# Patient Record
Sex: Male | Born: 1940 | Race: Black or African American | Hispanic: No | Marital: Married | Smoking: Former smoker
Health system: Southern US, Community
[De-identification: ages and names within clinical notes are randomized; demographics above are authoritative.]

## PROBLEM LIST (undated history)

## (undated) DIAGNOSIS — E785 Hyperlipidemia, unspecified: Secondary | ICD-10-CM

## (undated) HISTORY — DX: Hyperlipidemia, unspecified: E78.5

---

## 1999-01-04 ENCOUNTER — Ambulatory Visit (HOSPITAL_COMMUNITY): Admission: RE | Admit: 1999-01-04 | Discharge: 1999-01-04 | Payer: Self-pay | Admitting: *Deleted

## 2000-10-16 ENCOUNTER — Encounter: Payer: Self-pay | Admitting: Surgery

## 2000-10-20 ENCOUNTER — Ambulatory Visit (HOSPITAL_COMMUNITY): Admission: RE | Admit: 2000-10-20 | Discharge: 2000-10-20 | Payer: Self-pay | Admitting: Surgery

## 2003-09-24 HISTORY — PX: INGUINAL HERNIA REPAIR: SHX194

## 2003-12-30 ENCOUNTER — Ambulatory Visit (HOSPITAL_COMMUNITY): Admission: RE | Admit: 2003-12-30 | Discharge: 2003-12-30 | Payer: Self-pay | Admitting: Surgery

## 2004-01-03 ENCOUNTER — Emergency Department (HOSPITAL_COMMUNITY): Admission: EM | Admit: 2004-01-03 | Discharge: 2004-01-03 | Payer: Self-pay | Admitting: Emergency Medicine

## 2004-06-18 ENCOUNTER — Ambulatory Visit (HOSPITAL_COMMUNITY): Admission: RE | Admit: 2004-06-18 | Discharge: 2004-06-18 | Payer: Self-pay | Admitting: *Deleted

## 2004-09-11 ENCOUNTER — Encounter: Admission: RE | Admit: 2004-09-11 | Discharge: 2004-09-11 | Payer: Self-pay | Admitting: Otolaryngology

## 2005-01-03 ENCOUNTER — Encounter: Admission: RE | Admit: 2005-01-03 | Discharge: 2005-01-03 | Payer: Self-pay | Admitting: Internal Medicine

## 2005-07-01 ENCOUNTER — Ambulatory Visit (HOSPITAL_COMMUNITY): Admission: RE | Admit: 2005-07-01 | Discharge: 2005-07-02 | Payer: Self-pay | Admitting: Ophthalmology

## 2007-08-04 ENCOUNTER — Encounter: Admission: RE | Admit: 2007-08-04 | Discharge: 2007-08-04 | Payer: Self-pay | Admitting: Interventional Radiology

## 2007-10-07 ENCOUNTER — Encounter: Admission: RE | Admit: 2007-10-07 | Discharge: 2007-10-07 | Payer: Self-pay | Admitting: Interventional Radiology

## 2007-10-15 ENCOUNTER — Encounter: Admission: RE | Admit: 2007-10-15 | Discharge: 2007-10-15 | Payer: Self-pay | Admitting: Interventional Radiology

## 2007-11-12 ENCOUNTER — Encounter: Admission: RE | Admit: 2007-11-12 | Discharge: 2007-11-12 | Payer: Self-pay | Admitting: Interventional Radiology

## 2008-02-17 ENCOUNTER — Encounter: Admission: RE | Admit: 2008-02-17 | Discharge: 2008-02-17 | Payer: Self-pay | Admitting: Orthopedic Surgery

## 2008-04-13 ENCOUNTER — Encounter: Admission: RE | Admit: 2008-04-13 | Discharge: 2008-04-13 | Payer: Self-pay | Admitting: Interventional Radiology

## 2008-12-31 ENCOUNTER — Emergency Department (HOSPITAL_COMMUNITY): Admission: EM | Admit: 2008-12-31 | Discharge: 2008-12-31 | Payer: Self-pay | Admitting: Emergency Medicine

## 2011-02-08 NOTE — Op Note (Signed)
NAMEKIMBALL, Joseph [name]              ACCOUNT NO.:  [id number]   MEDICAL RECORD NO.:  [id number]          PATIENT TYPE:  AMB   LOCATION:  SDS                          FACILITY:  [facility]   PHYSICIAN:  Lanna Poche, M.D. DATE OF BIRTH:  Jan 21, 1941   DATE OF PROCEDURE:  07/01/2005  DATE OF DISCHARGE:                                 OPERATIVE REPORT   PREOPERATIVE DIAGNOSIS:  Complex retinal detachment with vitreous  constrictions, severe myopia and small cornea, left eye.   POSTOPERATIVE DIAGNOSIS:   OPERATION PERFORMED:  Pars plana vitrectomy, scleral buckle, laser for  retinal break, gas-fluid exchange 60% C3F8, left eye.   SURGEON:  Lanna Poche, M.D.   ASSISTANT:  Ulysees Barns.   ANESTHESIA:  General endotracheal.   ESTIMATED BLOOD LOSS:  Less than 1 mL.   COMPLICATIONS:  None.   DESCRIPTION OF PROCEDURE:  The patient was taken to the operating room where  after induction of general anesthesia, the left eye was prepped and draped  in usual fashion.  The lid speculum was introduced and the cornea diameter  was measured and found to be pathologically small at just under 10 mm.  The  conjunctiva was opened 360 degrees.  Relaxing incision nasally and  temporally.  Hemostasis was obtained with eraser cautery. Sclerotomies were  fashioned 3.75 mm posterior to the limbus at 1:30, 10:30 and 4:30.  Superior  sclerotomies were plugged and a 4 mm infusion cannula was secured to the  inferotemporal sclerotomy with a temporary suture of 7-0 Vicryl.  The tip  was visually inspected and found to be in good position.  A Landers ring was  secured to the globe with 7-0 Vicryl sutures at 3 and 9 o'clock.  Using a 25  gauge vitrector and endo illuminator, a central core followed by peripheral  vitrectomy was performed.  There was a detachment involving approximately  two thirds of the retina with the superonasal quadrant still attached.  There was considerable central vitreous  organization with anterior and  central traction of the retina.  After completion of the central core  vitrectomy and a partial peripheral vitrectomy, perfluoron was used to  stabilize the posterior pole and flatten the retina somewhat.  Scleral  depression was then used to meticulously trim the vitreous base.  The  vitreous base was found to be pathologically long at its insertion from the  ora serrata almost back to the equator.  At the equatorial retina, there  were several tears seen in the temporal retina.  No additional breaks or  tears were seen.  After completion of the peripheral vitrectomy, the  instruments were removed from the eye and the holes were plugged.  Scleral  depression with the indirect ophthalmoscope showed there to be no additional  breaks or tears. Perfluoron was instilled in the eye for the full volume,  flattening the retina.  Indirect laser photocoagulation was then applied 360  degrees for several rows posterior to the ora with a good treatment all way  back to the equator and around the large retinal and jagged breaks at the  equatorial  retina temporally.  After completion of the peripheral laser, a  gas-fluid exchange was then performed with the indirect followed by the  biconcave lens.  Inspection of the posterior pole showed there to be good  laser around the large breaks with three small linear slits in the retina  seen posterior to the equatorial breaks.  The indirect laser was used to  photocoagulate around these breaks as well with inspection afterwards  internally showed there to be good photocoagulation around all three retinal  breaks.  Macula was flat and attached.  The superior sclerotomy was then  temporarily closed with 7-0 Vicryl.  The lid speculum was then removed and  the lids placed on traction sutures of 4-0 silk.  The four rectus muscles  were isolated and slung on sutures of 2-0 silk.  The four quadrants were  inspected and found not to be  thin.  The temporal limbus to muscle insertion  ws measured and found to be almost at 9 to 10 mm in length.  A 277 buckle  with a 240 band was then passed 360 degrees under the rectus muscles and  joined in the superonasal quadrant with a 270 sleeve.  Mattress sutures of 4-  0 Mersilene were passed two to each quadrant in a horizontal mattress  fashion.  The ends were rotated posteriorly.  After completion of the  buckle, the internal inspection showed there to be good buckle at 360  degrees with the posterior breaks on the downslope of the buckle and the  large ____was fairly well supported by the buckle.  The traction sutures  were then removed and the lid speculum was introduced.  The superotemporal  sclerotomy was closed with 7-0 Vicryl permanently.  A mixture of 16% C3F8  was drawn up in the usual sterile fashion and 20 mL infused in the eye with  egress through the superonasal sclerotomy.  The superonasal sclerotomy was  then closed.  The pressure was adjusted to 21 mmHg with Barraquer tonometer.  Infusion cannula was removed and preplaced sutures secured.  The conjunctiva  was then drawn up and reapproximated with interrupted and running sutures of  6-0 plain gut.  Subconjunctival space was irrigated with 0.75% Marcaine as  well as mixed antibiotic solution followed by subconjunctival injection of  100 mg of ceftazidime and 10 mg of Decadron.  The lid speculum was then  removed and mixed antibiotic ointment was applied to the surface of the eye.  An eye patch and shield were then placed on the patient's eye.  Upon waking  from anesthesia, the patient left the operating room in stable condition.      Lanna Poche, M.D.     JTH/MEDQ  D:  07/01/2005  T:  07/02/2005  Job:  [id number]

## 2011-02-08 NOTE — Op Note (Signed)
NAME:  Joseph [name], Joseph [name]                        ACCOUNT NO.:  [id number]   MEDICAL RECORD NO.:  [id number]                   PATIENT TYPE:  AMB   LOCATION:  DAY                                  [facility]ACILITY:  [facility]   PHYSICIAN:  Abigail Miyamoto, M.D.              DATE OF BIRTH:  August 27, 1941   DATE OF PROCEDURE:  12/30/2003  DATE OF DISCHARGE:                                 OPERATIVE REPORT   PREOPERATIVE DIAGNOSES:  Recurrent right inguinal hernia.   POSTOPERATIVE DIAGNOSES:  Recurrent right inguinal hernia.   PROCEDURE:  Right inguinal hernia repair with mesh.   SURGEON:  Abigail Miyamoto, M.D.   ANESTHESIA:  General endotracheal anesthesia and 0.5% Marcaine.   ESTIMATED BLOOD LOSS:  Minimal.   FINDINGS:  The patient was found to have a large hernia coming through the  internal ring that was separate from the cord structures. It appeared that  the laparoscopic mesh had failed laterally.   DESCRIPTION OF PROCEDURE:  The patient was brought to the operating room,  identified as Joseph [name].  He was placed supine on the operating room  table and general anesthesia was induced.  His right groin was then prepped  and draped in the usual sterile fashion.  An ilioinguinal nerve block was  then performed with 0.5% Marcaine, the skin was likewise anesthetized with  0.5% Marcaine. A longitudinal incision was then made in the right groin with  the #15 blade. The incision was carried down through Scarpa's fascia with  electrocautery. The external oblique fascia was then identified and opened  with electrocautery and further toward the external ring with the Metzenbaum  scissors.  A large hernia sac was then easily identified and contained  omentum. It appeared separate from the testicular cord and its structures.  The testicular cord and its strictures were controlled with a Penrose drain.  The hernia sac itself appeared to be coming out of the internal ring.  The  contents were reduced  back into the internal ring and the preperitoneal  space examined with a finger. The mesh was felt to be intact but may have  pulled away from the area of the internal ring.  At this point, a piece of  Prolene mesh from the [employer] hernia Prolene system was brought onto the  field. An extended piece of mesh was used.  The preperitoneal portion was  then placed down through the internal ring and opened up in the  preperitoneal space. The overlay portion was then opened up in the inguinal  floor. A slit was cut in it to incorporate around the cord structures. The  mesh was then sewn in place with interrupted 2-0 Vicryl sutures setting it  first to the pubic tubercle and then medially transversalis fascia and then  laterally along the shelving edge of the inguinal ligament. It was then sewn  around the cord structures.  Good coverage of the inguinal floor appeared  to  be achieved.  The external oblique fascia was then closed over this with a  running 2-0 Vicryl suture. Scarpa's fascia was closed with interrupted 3-0  Vicryl sutures, skin  was closed running 4-0 Monocryl. Steri-Strips, gauze and tape were then  applied. The patient tolerated the procedure well. All counts were correct  at the end of the procedure. The patient was then extubated in the operating  room and taken in stable condition to the recovery room.                                               Abigail Miyamoto, M.D.    DB/MEDQ  D:  12/30/2003  T:  12/30/2003  Job:  [id number]

## 2011-02-08 NOTE — Op Note (Signed)
[name], Joseph              ACCOUNT NO.:  [id number]   MEDICAL RECORD NO.:  [id number]          PATIENT TYPE:  AMB   LOCATION:  ENDO                         FACILITY:  [facility]   PHYSICIAN:  Georgiana Spinner, M.D.    DATE OF BIRTH:  08-Apr-1941   DATE OF PROCEDURE:  DATE OF DISCHARGE:                                 OPERATIVE REPORT   PROCEDURE:  Upper endoscopy.   INDICATIONS:  Hemoccult positivity.   ANESTHESIA:  Demerol 50, Versed 4 mg.   PROCEDURE:  With the patient mildly sedated in the left lateral decubitus  position, the Olympus videoscopic endoscope was inserted into the mouth,  passed under direct vision through the esophagus, which appeared normal,  into the stomach.  The fundus, body, antrum, duodenal bulb, and second  portion of the duodenum were visualized.  All appeared normal.  From this  point, the endoscope was slowly withdrawn, taking circumferential views of  the duodenal mucosa until the endoscope had pulled back into the stomach.  Placed in retroflexion, viewed the stomach from below.  The endoscope was  straightened and withdrawn, taking circumferential views of the remaining  gastric and esophageal mucosa.  Patient's vital signs and pulse oximeter  remained stable.  The patient tolerated the procedure well without apparent  complications.   FINDINGS:  Unremarkable examination.   PLAN:  Proceed to colonoscopy.      GMO/MEDQ  D:  06/18/2004  T:  06/18/2004  Job:  [id number]

## 2011-02-08 NOTE — Op Note (Signed)
[facility]  Patient:    ANMOL, FLECK                     MRN: [id number] Proc. Date: 10/20/00 Adm. Date:  [id number] Attending:  Abigail Miyamoto A                           Operative Report  PREOPERATIVE DIAGNOSIS:  Bilateral inguinal hernias and an umbilical hernia.  POSTOPERATIVE DIAGNOSIS:  Bilateral inguinal hernias and an umbilical hernia.  PROCEDURE:  Laparoscopic bilateral inguinal hernia repair with mesh. Umbilical hernia repair.  SURGEON:  Abigail Miyamoto, M.D.  ANESTHESIA:  General endotracheal and 0.25% Marcaine plain.  ESTIMATED BLOOD LOSS:  Minimal.  DESCRIPTION OF PROCEDURE:  The patient was brought to the operating room and identified as Tawny Hopping.  He was placed supine on the operating table, and general anesthesia was induced.  His abdomen and groin were then prepped and draped in the usual sterile fashion.  Using the #15 blade, a small, transverse incision was made at the umbilicus.  Incision was carried down to the fascia which was opened with the scalpel.  The rectus muscle was then identified, and the dissecting balloon was passed underneath the rectus sheath and down toward the pubis.  The dissecting balloon was then insufflated, dissecting out the preperitoneal space.  Once this was done, the balloon was removed, and a separate, small 10 mm port was placed at the umbilicus, and insufflation of the preperitoneal space was begun.  Next, a 5 mm port was placed in the patients midline under direct vision.  The balloon dissector had caused the rupture of a small bleeding vessel just below the pubis.  This was controlled with surgical clips.  A separate 5 mm port was also placed in the midline under direct vision.  Next, dissection of the inguinal area was carried out.  The testicular cord and its structure along with the hernia sac was identified on the right side.  The hernia sac was easily reduced. Coopers ligament  was also identified.  Dissection was then done on the left as well as the hernia sac with the testicular cord structures also identified and reduced.  Both right and left inguinal areas were dissected out well. Next, two separate 6 x 6 pieces of Prolene mesh were brought onto the field and fashioned appropriately.  Each mesh was then placed at the umbilical port and then packed in place in an onlay fashion over both the right and left cord structures, tacking into Coopers ligament and them medial up the abdominal wall and out laterally.  Excellent coverage of both hernia defects appeared to be achieved.  Hemostasis also appeared to be achieved.  At this point, both 5 mm ports were removed, and the preperitoneal space was deflated.  The mesh appeared to lay appropriately.  Next, all ports were removed.  The small defect in the umbilicus was then identified and closed with a figure-of-eight 0 Vicryl suture.  The facial defect from the port placement was then closed with a figure-of-eight 0 Vicryl suture as well.  The skin incisions were all anesthetized with 0.25% Marcaine.  The umbilical incision was then closed with a running 4-0 Monocryl.  The two separate 5 mm ports were closed with Dermabond.  The patient tolerated the procedure well.  All sponge, needle and instrument counts were correct at the end of the procedure.  The patient was then extubated  in the operating room and taken in stable condition to the recovery room. DD:  10/20/00 TD:  10/20/00 Job: [id number]

## 2011-02-08 NOTE — Op Note (Signed)
NAMESUNNY, GAINS              ACCOUNT NO.:  [id number]   MEDICAL RECORD NO.:  [id number]          PATIENT TYPE:  AMB   LOCATION:  ENDO                         FACILITY:  [facility]   PHYSICIAN:  Georgiana Spinner, M.D.    DATE OF BIRTH:  [birth year]   DATE OF PROCEDURE:  DATE OF DISCHARGE:                                 OPERATIVE REPORT   PROCEDURE:  Colonoscopy.   INDICATIONS:  Hemoccult positivity.   ANESTHESIA:  Demerol 10, Versed 3 mg additionally.   PROCEDURE:  With the patient mildly sedated in the left lateral decubitus  position, the Olympus videoscopic colonoscope was inserted into the rectum  and passed under direct vision to the cecum, identified by the ileocecal  valve and the appendiceal orifice, both of which were photographed.  From  this point, the colonoscope was slowly withdrawn, taking circumferential  views of the colonic mucosa, stopping in the rectum, which appeared normal  on direct and showed hemorrhoids on retroflexed view.  The endoscope was  straightened and withdrawn.  Patient's vital signs and pulse oximetry  remained stable.  Patient tolerated the procedure well without apparent  complications.   FINDINGS:  Unremarkable colonoscopic examination, other than hemorrhoids.   PLAN:  Have the patient follow up with me as needed.      GMO/MEDQ  D:  06/18/2004  T:  06/18/2004  Job:  [id number]

## 2011-09-30 DIAGNOSIS — H2510 Age-related nuclear cataract, unspecified eye: Secondary | ICD-10-CM | POA: Diagnosis not present

## 2011-09-30 DIAGNOSIS — H4011X0 Primary open-angle glaucoma, stage unspecified: Secondary | ICD-10-CM | POA: Diagnosis not present

## 2011-10-14 DIAGNOSIS — E7800 Pure hypercholesterolemia, unspecified: Secondary | ICD-10-CM | POA: Diagnosis not present

## 2011-10-14 DIAGNOSIS — D709 Neutropenia, unspecified: Secondary | ICD-10-CM | POA: Diagnosis not present

## 2011-10-14 DIAGNOSIS — E119 Type 2 diabetes mellitus without complications: Secondary | ICD-10-CM | POA: Diagnosis not present

## 2011-10-28 DIAGNOSIS — E119 Type 2 diabetes mellitus without complications: Secondary | ICD-10-CM | POA: Diagnosis not present

## 2011-10-28 DIAGNOSIS — I10 Essential (primary) hypertension: Secondary | ICD-10-CM | POA: Diagnosis not present

## 2011-10-28 DIAGNOSIS — E7800 Pure hypercholesterolemia, unspecified: Secondary | ICD-10-CM | POA: Diagnosis not present

## 2011-10-29 DIAGNOSIS — H2510 Age-related nuclear cataract, unspecified eye: Secondary | ICD-10-CM | POA: Diagnosis not present

## 2011-11-13 DIAGNOSIS — H2510 Age-related nuclear cataract, unspecified eye: Secondary | ICD-10-CM | POA: Diagnosis not present

## 2011-11-13 DIAGNOSIS — IMO0002 Reserved for concepts with insufficient information to code with codable children: Secondary | ICD-10-CM | POA: Diagnosis not present

## 2011-12-11 DIAGNOSIS — Z79899 Other long term (current) drug therapy: Secondary | ICD-10-CM | POA: Diagnosis not present

## 2011-12-11 DIAGNOSIS — E7800 Pure hypercholesterolemia, unspecified: Secondary | ICD-10-CM | POA: Diagnosis not present

## 2011-12-11 DIAGNOSIS — Z125 Encounter for screening for malignant neoplasm of prostate: Secondary | ICD-10-CM | POA: Diagnosis not present

## 2011-12-11 DIAGNOSIS — Z7902 Long term (current) use of antithrombotics/antiplatelets: Secondary | ICD-10-CM | POA: Diagnosis not present

## 2011-12-24 DIAGNOSIS — H409 Unspecified glaucoma: Secondary | ICD-10-CM | POA: Diagnosis not present

## 2011-12-24 DIAGNOSIS — H4011X0 Primary open-angle glaucoma, stage unspecified: Secondary | ICD-10-CM | POA: Diagnosis not present

## 2011-12-24 DIAGNOSIS — H26499 Other secondary cataract, unspecified eye: Secondary | ICD-10-CM | POA: Diagnosis not present

## 2011-12-31 DIAGNOSIS — Z87898 Personal history of other specified conditions: Secondary | ICD-10-CM | POA: Diagnosis not present

## 2011-12-31 DIAGNOSIS — I8390 Asymptomatic varicose veins of unspecified lower extremity: Secondary | ICD-10-CM | POA: Diagnosis not present

## 2011-12-31 DIAGNOSIS — Z0000 Encounter for general adult medical examination without abnormal findings: Secondary | ICD-10-CM | POA: Diagnosis not present

## 2011-12-31 DIAGNOSIS — D709 Neutropenia, unspecified: Secondary | ICD-10-CM | POA: Diagnosis not present

## 2011-12-31 DIAGNOSIS — E119 Type 2 diabetes mellitus without complications: Secondary | ICD-10-CM | POA: Diagnosis not present

## 2012-01-07 DIAGNOSIS — Z0000 Encounter for general adult medical examination without abnormal findings: Secondary | ICD-10-CM | POA: Diagnosis not present

## 2012-01-23 DIAGNOSIS — E119 Type 2 diabetes mellitus without complications: Secondary | ICD-10-CM | POA: Diagnosis not present

## 2012-01-23 DIAGNOSIS — E7800 Pure hypercholesterolemia, unspecified: Secondary | ICD-10-CM | POA: Diagnosis not present

## 2012-01-27 DIAGNOSIS — E7800 Pure hypercholesterolemia, unspecified: Secondary | ICD-10-CM | POA: Diagnosis not present

## 2012-01-27 DIAGNOSIS — I10 Essential (primary) hypertension: Secondary | ICD-10-CM | POA: Diagnosis not present

## 2012-01-27 DIAGNOSIS — E119 Type 2 diabetes mellitus without complications: Secondary | ICD-10-CM | POA: Diagnosis not present

## 2012-02-06 DIAGNOSIS — H4011X0 Primary open-angle glaucoma, stage unspecified: Secondary | ICD-10-CM | POA: Diagnosis not present

## 2012-03-19 DIAGNOSIS — E119 Type 2 diabetes mellitus without complications: Secondary | ICD-10-CM | POA: Diagnosis not present

## 2012-04-28 DIAGNOSIS — E119 Type 2 diabetes mellitus without complications: Secondary | ICD-10-CM | POA: Diagnosis not present

## 2012-04-28 DIAGNOSIS — E7800 Pure hypercholesterolemia, unspecified: Secondary | ICD-10-CM | POA: Diagnosis not present

## 2012-05-14 DIAGNOSIS — E7800 Pure hypercholesterolemia, unspecified: Secondary | ICD-10-CM | POA: Diagnosis not present

## 2012-05-14 DIAGNOSIS — I10 Essential (primary) hypertension: Secondary | ICD-10-CM | POA: Diagnosis not present

## 2012-05-14 DIAGNOSIS — E119 Type 2 diabetes mellitus without complications: Secondary | ICD-10-CM | POA: Diagnosis not present

## 2012-06-08 DIAGNOSIS — Z23 Encounter for immunization: Secondary | ICD-10-CM | POA: Diagnosis not present

## 2012-07-08 DIAGNOSIS — H4011X0 Primary open-angle glaucoma, stage unspecified: Secondary | ICD-10-CM | POA: Diagnosis not present

## 2012-07-08 DIAGNOSIS — H31019 Macula scars of posterior pole (postinflammatory) (post-traumatic), unspecified eye: Secondary | ICD-10-CM | POA: Diagnosis not present

## 2012-07-08 DIAGNOSIS — H2700 Aphakia, unspecified eye: Secondary | ICD-10-CM | POA: Diagnosis not present

## 2012-11-10 DIAGNOSIS — E7800 Pure hypercholesterolemia, unspecified: Secondary | ICD-10-CM | POA: Diagnosis not present

## 2012-11-10 DIAGNOSIS — E119 Type 2 diabetes mellitus without complications: Secondary | ICD-10-CM | POA: Diagnosis not present

## 2012-11-12 DIAGNOSIS — I10 Essential (primary) hypertension: Secondary | ICD-10-CM | POA: Diagnosis not present

## 2012-11-12 DIAGNOSIS — E119 Type 2 diabetes mellitus without complications: Secondary | ICD-10-CM | POA: Diagnosis not present

## 2012-11-12 DIAGNOSIS — E7800 Pure hypercholesterolemia, unspecified: Secondary | ICD-10-CM | POA: Diagnosis not present

## 2012-12-07 DIAGNOSIS — H2700 Aphakia, unspecified eye: Secondary | ICD-10-CM | POA: Diagnosis not present

## 2012-12-07 DIAGNOSIS — H31019 Macula scars of posterior pole (postinflammatory) (post-traumatic), unspecified eye: Secondary | ICD-10-CM | POA: Diagnosis not present

## 2012-12-07 DIAGNOSIS — H4011X0 Primary open-angle glaucoma, stage unspecified: Secondary | ICD-10-CM | POA: Diagnosis not present

## 2012-12-30 DIAGNOSIS — Z125 Encounter for screening for malignant neoplasm of prostate: Secondary | ICD-10-CM | POA: Diagnosis not present

## 2012-12-30 DIAGNOSIS — E7800 Pure hypercholesterolemia, unspecified: Secondary | ICD-10-CM | POA: Diagnosis not present

## 2012-12-30 DIAGNOSIS — Z0000 Encounter for general adult medical examination without abnormal findings: Secondary | ICD-10-CM | POA: Diagnosis not present

## 2012-12-30 DIAGNOSIS — Z79899 Other long term (current) drug therapy: Secondary | ICD-10-CM | POA: Diagnosis not present

## 2012-12-30 DIAGNOSIS — I10 Essential (primary) hypertension: Secondary | ICD-10-CM | POA: Diagnosis not present

## 2013-01-06 DIAGNOSIS — I10 Essential (primary) hypertension: Secondary | ICD-10-CM | POA: Diagnosis not present

## 2013-01-06 DIAGNOSIS — E7800 Pure hypercholesterolemia, unspecified: Secondary | ICD-10-CM | POA: Diagnosis not present

## 2013-01-06 DIAGNOSIS — G4700 Insomnia, unspecified: Secondary | ICD-10-CM | POA: Diagnosis not present

## 2013-01-06 DIAGNOSIS — E119 Type 2 diabetes mellitus without complications: Secondary | ICD-10-CM | POA: Diagnosis not present

## 2013-02-05 DIAGNOSIS — E119 Type 2 diabetes mellitus without complications: Secondary | ICD-10-CM | POA: Diagnosis not present

## 2013-02-05 DIAGNOSIS — E7800 Pure hypercholesterolemia, unspecified: Secondary | ICD-10-CM | POA: Diagnosis not present

## 2013-02-09 DIAGNOSIS — E119 Type 2 diabetes mellitus without complications: Secondary | ICD-10-CM | POA: Diagnosis not present

## 2013-02-09 DIAGNOSIS — N400 Enlarged prostate without lower urinary tract symptoms: Secondary | ICD-10-CM | POA: Diagnosis not present

## 2013-02-09 DIAGNOSIS — E7800 Pure hypercholesterolemia, unspecified: Secondary | ICD-10-CM | POA: Diagnosis not present

## 2013-02-09 DIAGNOSIS — I10 Essential (primary) hypertension: Secondary | ICD-10-CM | POA: Diagnosis not present

## 2013-03-18 DIAGNOSIS — H2700 Aphakia, unspecified eye: Secondary | ICD-10-CM | POA: Diagnosis not present

## 2013-03-18 DIAGNOSIS — H47239 Glaucomatous optic atrophy, unspecified eye: Secondary | ICD-10-CM | POA: Diagnosis not present

## 2013-05-14 DIAGNOSIS — E119 Type 2 diabetes mellitus without complications: Secondary | ICD-10-CM | POA: Diagnosis not present

## 2013-05-14 DIAGNOSIS — E7800 Pure hypercholesterolemia, unspecified: Secondary | ICD-10-CM | POA: Diagnosis not present

## 2013-05-20 DIAGNOSIS — E7800 Pure hypercholesterolemia, unspecified: Secondary | ICD-10-CM | POA: Diagnosis not present

## 2013-05-20 DIAGNOSIS — Z006 Encounter for examination for normal comparison and control in clinical research program: Secondary | ICD-10-CM | POA: Diagnosis not present

## 2013-05-20 DIAGNOSIS — I10 Essential (primary) hypertension: Secondary | ICD-10-CM | POA: Diagnosis not present

## 2013-05-20 DIAGNOSIS — E119 Type 2 diabetes mellitus without complications: Secondary | ICD-10-CM | POA: Diagnosis not present

## 2013-07-08 DIAGNOSIS — Z23 Encounter for immunization: Secondary | ICD-10-CM | POA: Diagnosis not present

## 2013-07-19 DIAGNOSIS — H47239 Glaucomatous optic atrophy, unspecified eye: Secondary | ICD-10-CM | POA: Diagnosis not present

## 2013-07-19 DIAGNOSIS — H4011X0 Primary open-angle glaucoma, stage unspecified: Secondary | ICD-10-CM | POA: Diagnosis not present

## 2013-07-19 DIAGNOSIS — H2700 Aphakia, unspecified eye: Secondary | ICD-10-CM | POA: Diagnosis not present

## 2013-09-28 DIAGNOSIS — E7800 Pure hypercholesterolemia, unspecified: Secondary | ICD-10-CM | POA: Diagnosis not present

## 2013-09-28 DIAGNOSIS — E119 Type 2 diabetes mellitus without complications: Secondary | ICD-10-CM | POA: Diagnosis not present

## 2013-09-30 DIAGNOSIS — I10 Essential (primary) hypertension: Secondary | ICD-10-CM | POA: Diagnosis not present

## 2013-09-30 DIAGNOSIS — E119 Type 2 diabetes mellitus without complications: Secondary | ICD-10-CM | POA: Diagnosis not present

## 2013-09-30 DIAGNOSIS — E7800 Pure hypercholesterolemia, unspecified: Secondary | ICD-10-CM | POA: Diagnosis not present

## 2013-10-19 DIAGNOSIS — H47239 Glaucomatous optic atrophy, unspecified eye: Secondary | ICD-10-CM | POA: Diagnosis not present

## 2013-10-19 DIAGNOSIS — H2700 Aphakia, unspecified eye: Secondary | ICD-10-CM | POA: Diagnosis not present

## 2013-10-19 DIAGNOSIS — H4011X0 Primary open-angle glaucoma, stage unspecified: Secondary | ICD-10-CM | POA: Diagnosis not present

## 2013-12-28 DIAGNOSIS — E7800 Pure hypercholesterolemia, unspecified: Secondary | ICD-10-CM | POA: Diagnosis not present

## 2013-12-28 DIAGNOSIS — E119 Type 2 diabetes mellitus without complications: Secondary | ICD-10-CM | POA: Diagnosis not present

## 2013-12-30 DIAGNOSIS — E119 Type 2 diabetes mellitus without complications: Secondary | ICD-10-CM | POA: Diagnosis not present

## 2013-12-30 DIAGNOSIS — I10 Essential (primary) hypertension: Secondary | ICD-10-CM | POA: Diagnosis not present

## 2013-12-30 DIAGNOSIS — E7800 Pure hypercholesterolemia, unspecified: Secondary | ICD-10-CM | POA: Diagnosis not present

## 2014-01-14 DIAGNOSIS — Z125 Encounter for screening for malignant neoplasm of prostate: Secondary | ICD-10-CM | POA: Diagnosis not present

## 2014-01-14 DIAGNOSIS — N400 Enlarged prostate without lower urinary tract symptoms: Secondary | ICD-10-CM | POA: Diagnosis not present

## 2014-01-14 DIAGNOSIS — Z0000 Encounter for general adult medical examination without abnormal findings: Secondary | ICD-10-CM | POA: Diagnosis not present

## 2014-01-14 DIAGNOSIS — Z23 Encounter for immunization: Secondary | ICD-10-CM | POA: Diagnosis not present

## 2014-01-14 DIAGNOSIS — E119 Type 2 diabetes mellitus without complications: Secondary | ICD-10-CM | POA: Diagnosis not present

## 2014-01-14 DIAGNOSIS — D72819 Decreased white blood cell count, unspecified: Secondary | ICD-10-CM | POA: Diagnosis not present

## 2014-01-14 DIAGNOSIS — I10 Essential (primary) hypertension: Secondary | ICD-10-CM | POA: Diagnosis not present

## 2014-01-20 DIAGNOSIS — Z961 Presence of intraocular lens: Secondary | ICD-10-CM | POA: Diagnosis not present

## 2014-02-09 DIAGNOSIS — Z1212 Encounter for screening for malignant neoplasm of rectum: Secondary | ICD-10-CM | POA: Diagnosis not present

## 2014-02-09 DIAGNOSIS — E119 Type 2 diabetes mellitus without complications: Secondary | ICD-10-CM | POA: Diagnosis not present

## 2014-02-09 DIAGNOSIS — H409 Unspecified glaucoma: Secondary | ICD-10-CM | POA: Diagnosis not present

## 2014-02-09 DIAGNOSIS — E7800 Pure hypercholesterolemia, unspecified: Secondary | ICD-10-CM | POA: Diagnosis not present

## 2014-02-09 DIAGNOSIS — I10 Essential (primary) hypertension: Secondary | ICD-10-CM | POA: Diagnosis not present

## 2014-02-16 DIAGNOSIS — H4011X0 Primary open-angle glaucoma, stage unspecified: Secondary | ICD-10-CM | POA: Diagnosis not present

## 2014-02-16 DIAGNOSIS — H26499 Other secondary cataract, unspecified eye: Secondary | ICD-10-CM | POA: Diagnosis not present

## 2014-03-29 DIAGNOSIS — D72819 Decreased white blood cell count, unspecified: Secondary | ICD-10-CM | POA: Diagnosis not present

## 2014-03-29 DIAGNOSIS — E7800 Pure hypercholesterolemia, unspecified: Secondary | ICD-10-CM | POA: Diagnosis not present

## 2014-03-29 DIAGNOSIS — E119 Type 2 diabetes mellitus without complications: Secondary | ICD-10-CM | POA: Diagnosis not present

## 2014-03-29 DIAGNOSIS — I10 Essential (primary) hypertension: Secondary | ICD-10-CM | POA: Diagnosis not present

## 2014-03-31 DIAGNOSIS — E7800 Pure hypercholesterolemia, unspecified: Secondary | ICD-10-CM | POA: Diagnosis not present

## 2014-03-31 DIAGNOSIS — I10 Essential (primary) hypertension: Secondary | ICD-10-CM | POA: Diagnosis not present

## 2014-03-31 DIAGNOSIS — E119 Type 2 diabetes mellitus without complications: Secondary | ICD-10-CM | POA: Diagnosis not present

## 2014-04-04 DIAGNOSIS — H409 Unspecified glaucoma: Secondary | ICD-10-CM | POA: Diagnosis not present

## 2014-04-04 DIAGNOSIS — E11359 Type 2 diabetes mellitus with proliferative diabetic retinopathy without macular edema: Secondary | ICD-10-CM | POA: Diagnosis not present

## 2014-04-04 DIAGNOSIS — E11311 Type 2 diabetes mellitus with unspecified diabetic retinopathy with macular edema: Secondary | ICD-10-CM | POA: Diagnosis not present

## 2014-04-04 DIAGNOSIS — E1139 Type 2 diabetes mellitus with other diabetic ophthalmic complication: Secondary | ICD-10-CM | POA: Diagnosis not present

## 2014-04-04 DIAGNOSIS — H4011X0 Primary open-angle glaucoma, stage unspecified: Secondary | ICD-10-CM | POA: Diagnosis not present

## 2014-04-11 ENCOUNTER — Encounter (INDEPENDENT_AMBULATORY_CARE_PROVIDER_SITE_OTHER): Payer: Medicare Other | Admitting: Ophthalmology

## 2014-04-11 DIAGNOSIS — H43819 Vitreous degeneration, unspecified eye: Secondary | ICD-10-CM

## 2014-04-11 DIAGNOSIS — H35379 Puckering of macula, unspecified eye: Secondary | ICD-10-CM

## 2014-04-11 DIAGNOSIS — H35359 Cystoid macular degeneration, unspecified eye: Secondary | ICD-10-CM | POA: Diagnosis not present

## 2014-04-11 DIAGNOSIS — H33009 Unspecified retinal detachment with retinal break, unspecified eye: Secondary | ICD-10-CM | POA: Diagnosis not present

## 2014-04-11 DIAGNOSIS — H2700 Aphakia, unspecified eye: Secondary | ICD-10-CM

## 2014-05-23 ENCOUNTER — Encounter (INDEPENDENT_AMBULATORY_CARE_PROVIDER_SITE_OTHER): Payer: Medicare Other | Admitting: Ophthalmology

## 2014-05-23 DIAGNOSIS — H4420 Degenerative myopia, unspecified eye: Secondary | ICD-10-CM

## 2014-05-23 DIAGNOSIS — H35359 Cystoid macular degeneration, unspecified eye: Secondary | ICD-10-CM

## 2014-05-23 DIAGNOSIS — H33009 Unspecified retinal detachment with retinal break, unspecified eye: Secondary | ICD-10-CM

## 2014-05-23 DIAGNOSIS — H43819 Vitreous degeneration, unspecified eye: Secondary | ICD-10-CM

## 2014-05-23 DIAGNOSIS — H35379 Puckering of macula, unspecified eye: Secondary | ICD-10-CM

## 2014-05-23 DIAGNOSIS — H2700 Aphakia, unspecified eye: Secondary | ICD-10-CM

## 2014-06-15 ENCOUNTER — Encounter (INDEPENDENT_AMBULATORY_CARE_PROVIDER_SITE_OTHER): Payer: Medicare Other | Admitting: Ophthalmology

## 2014-06-15 DIAGNOSIS — H43819 Vitreous degeneration, unspecified eye: Secondary | ICD-10-CM

## 2014-06-15 DIAGNOSIS — H4420 Degenerative myopia, unspecified eye: Secondary | ICD-10-CM

## 2014-06-15 DIAGNOSIS — H35359 Cystoid macular degeneration, unspecified eye: Secondary | ICD-10-CM | POA: Diagnosis not present

## 2014-06-15 DIAGNOSIS — H35379 Puckering of macula, unspecified eye: Secondary | ICD-10-CM

## 2014-06-15 DIAGNOSIS — H33009 Unspecified retinal detachment with retinal break, unspecified eye: Secondary | ICD-10-CM

## 2014-06-24 DIAGNOSIS — E11341 Type 2 diabetes mellitus with severe nonproliferative diabetic retinopathy with macular edema: Secondary | ICD-10-CM | POA: Diagnosis not present

## 2014-06-24 DIAGNOSIS — H4011X2 Primary open-angle glaucoma, moderate stage: Secondary | ICD-10-CM | POA: Diagnosis not present

## 2014-06-28 DIAGNOSIS — E119 Type 2 diabetes mellitus without complications: Secondary | ICD-10-CM | POA: Diagnosis not present

## 2014-06-28 DIAGNOSIS — E780 Pure hypercholesterolemia: Secondary | ICD-10-CM | POA: Diagnosis not present

## 2014-06-28 DIAGNOSIS — I10 Essential (primary) hypertension: Secondary | ICD-10-CM | POA: Diagnosis not present

## 2014-06-30 DIAGNOSIS — E119 Type 2 diabetes mellitus without complications: Secondary | ICD-10-CM | POA: Diagnosis not present

## 2014-06-30 DIAGNOSIS — I10 Essential (primary) hypertension: Secondary | ICD-10-CM | POA: Diagnosis not present

## 2014-06-30 DIAGNOSIS — D72819 Decreased white blood cell count, unspecified: Secondary | ICD-10-CM | POA: Diagnosis not present

## 2014-06-30 DIAGNOSIS — E780 Pure hypercholesterolemia: Secondary | ICD-10-CM | POA: Diagnosis not present

## 2014-07-25 ENCOUNTER — Encounter (INDEPENDENT_AMBULATORY_CARE_PROVIDER_SITE_OTHER): Payer: Medicare Other | Admitting: Ophthalmology

## 2014-07-27 ENCOUNTER — Encounter (INDEPENDENT_AMBULATORY_CARE_PROVIDER_SITE_OTHER): Payer: Medicare Other | Admitting: Ophthalmology

## 2014-07-27 DIAGNOSIS — H2702 Aphakia, left eye: Secondary | ICD-10-CM

## 2014-07-27 DIAGNOSIS — H59031 Cystoid macular edema following cataract surgery, right eye: Secondary | ICD-10-CM | POA: Diagnosis not present

## 2014-07-27 DIAGNOSIS — H35371 Puckering of macula, right eye: Secondary | ICD-10-CM

## 2014-07-27 DIAGNOSIS — H43813 Vitreous degeneration, bilateral: Secondary | ICD-10-CM

## 2014-07-27 DIAGNOSIS — H4423 Degenerative myopia, bilateral: Secondary | ICD-10-CM | POA: Diagnosis not present

## 2014-07-27 DIAGNOSIS — H35411 Lattice degeneration of retina, right eye: Secondary | ICD-10-CM | POA: Diagnosis not present

## 2014-09-29 DIAGNOSIS — H31019 Macula scars of posterior pole (postinflammatory) (post-traumatic), unspecified eye: Secondary | ICD-10-CM | POA: Diagnosis not present

## 2014-09-29 DIAGNOSIS — E11341 Type 2 diabetes mellitus with severe nonproliferative diabetic retinopathy with macular edema: Secondary | ICD-10-CM | POA: Diagnosis not present

## 2014-09-29 DIAGNOSIS — H4011X2 Primary open-angle glaucoma, moderate stage: Secondary | ICD-10-CM | POA: Diagnosis not present

## 2014-10-25 DIAGNOSIS — D175 Benign lipomatous neoplasm of intra-abdominal organs: Secondary | ICD-10-CM | POA: Diagnosis not present

## 2014-10-25 DIAGNOSIS — Z86010 Personal history of colonic polyps: Secondary | ICD-10-CM | POA: Diagnosis not present

## 2014-10-25 DIAGNOSIS — Z09 Encounter for follow-up examination after completed treatment for conditions other than malignant neoplasm: Secondary | ICD-10-CM | POA: Diagnosis not present

## 2014-10-25 DIAGNOSIS — K5730 Diverticulosis of large intestine without perforation or abscess without bleeding: Secondary | ICD-10-CM | POA: Diagnosis not present

## 2014-11-14 ENCOUNTER — Ambulatory Visit: Payer: Medicare Other

## 2014-11-14 ENCOUNTER — Encounter: Payer: Self-pay | Admitting: Podiatry

## 2014-11-14 ENCOUNTER — Ambulatory Visit (INDEPENDENT_AMBULATORY_CARE_PROVIDER_SITE_OTHER): Payer: 59 | Admitting: Podiatry

## 2014-11-14 VITALS — BP 142/81 | HR 60 | Resp 16

## 2014-11-14 DIAGNOSIS — L600 Ingrowing nail: Secondary | ICD-10-CM

## 2014-11-14 DIAGNOSIS — M79673 Pain in unspecified foot: Secondary | ICD-10-CM

## 2014-11-14 NOTE — Patient Instructions (Signed)

## 2014-11-14 NOTE — Progress Notes (Signed)
   Subjective:    Patient ID: Joseph [name], male    DOB: [birth year], 74 y.o.   MRN: [id number]  HPI Comments: "I have pain on top of the nail"  Patient c/o tenderness 1st toe/nail bilateral, left over right, for few months. He has had the medial corner of nail removed from 1st toenail left. Feels a lot of pressure on top of the nail with shoes.  Toe Pain       Review of Systems  All other systems reviewed and are negative.      Objective:   Physical Exam        Assessment & Plan:

## 2014-11-14 NOTE — Progress Notes (Signed)
Subjective:     Patient ID: Joseph [name], male   DOB: December 08, 1940, 74 y.o.   MRN: [id number]  HPI patient presents stating I have ingrown toenails on both my big toes which bother me and they hurt when she tendon at night or when I wear shoes. Has been going on for at least a few months   Review of Systems  All other systems reviewed and are negative.      Objective:   Physical Exam  Constitutional: He is oriented to person, place, and time.  Cardiovascular: Intact distal pulses.   Musculoskeletal: Normal range of motion.  Neurological: He is oriented to person, place, and time.  Skin: Skin is warm.  Nursing note and vitals reviewed.  neurovascular status intact with muscle strength adequate and range of motion subtalar midtarsal joint within normal limits. Patient's found to have incurvated hallux nail borders medial border bilateral that are painful when pressed and making shoe gear difficult. Patient is well oriented 3 and has good digital perfusion noted     Assessment:     Ingrown toenail deformity with damage to the nailbeds hallux bilateral medial border    Plan:     H&P and condition reviewed discussed with patient. I've recommended removal of the corners and I explained procedure and reviewed risk. Patient wants surgery and today I infiltrated each hallux 60 mg Xylocaine Marcaine mixture under sterile technique remove the medial border exposed matrix and applied phenol 3 applications 30 seconds followed by alcohol lavage and sterile dressing. Gave instructions on soaking and reappoint

## 2014-11-25 ENCOUNTER — Ambulatory Visit (INDEPENDENT_AMBULATORY_CARE_PROVIDER_SITE_OTHER): Payer: 59 | Admitting: Ophthalmology

## 2014-11-25 DIAGNOSIS — H2702 Aphakia, left eye: Secondary | ICD-10-CM

## 2014-11-25 DIAGNOSIS — H43813 Vitreous degeneration, bilateral: Secondary | ICD-10-CM

## 2014-11-25 DIAGNOSIS — H338 Other retinal detachments: Secondary | ICD-10-CM

## 2014-11-25 DIAGNOSIS — H59031 Cystoid macular edema following cataract surgery, right eye: Secondary | ICD-10-CM | POA: Diagnosis not present

## 2014-11-25 DIAGNOSIS — H4423 Degenerative myopia, bilateral: Secondary | ICD-10-CM

## 2015-05-30 ENCOUNTER — Ambulatory Visit (INDEPENDENT_AMBULATORY_CARE_PROVIDER_SITE_OTHER): Payer: 59 | Admitting: Ophthalmology

## 2015-06-26 DIAGNOSIS — Z23 Encounter for immunization: Secondary | ICD-10-CM | POA: Diagnosis not present

## 2015-06-29 DIAGNOSIS — E119 Type 2 diabetes mellitus without complications: Secondary | ICD-10-CM | POA: Diagnosis not present

## 2015-06-29 DIAGNOSIS — E7800 Pure hypercholesterolemia, unspecified: Secondary | ICD-10-CM | POA: Diagnosis not present

## 2015-06-29 DIAGNOSIS — I10 Essential (primary) hypertension: Secondary | ICD-10-CM | POA: Diagnosis not present

## 2015-07-04 ENCOUNTER — Ambulatory Visit (INDEPENDENT_AMBULATORY_CARE_PROVIDER_SITE_OTHER): Payer: 59 | Admitting: Ophthalmology

## 2015-07-04 DIAGNOSIS — H4423 Degenerative myopia, bilateral: Secondary | ICD-10-CM

## 2015-07-04 DIAGNOSIS — H59031 Cystoid macular edema following cataract surgery, right eye: Secondary | ICD-10-CM

## 2015-07-04 DIAGNOSIS — H338 Other retinal detachments: Secondary | ICD-10-CM

## 2015-07-04 DIAGNOSIS — H43813 Vitreous degeneration, bilateral: Secondary | ICD-10-CM

## 2015-07-04 DIAGNOSIS — H35411 Lattice degeneration of retina, right eye: Secondary | ICD-10-CM

## 2015-07-06 DIAGNOSIS — I10 Essential (primary) hypertension: Secondary | ICD-10-CM | POA: Diagnosis not present

## 2015-07-06 DIAGNOSIS — E7800 Pure hypercholesterolemia, unspecified: Secondary | ICD-10-CM | POA: Diagnosis not present

## 2015-07-06 DIAGNOSIS — E119 Type 2 diabetes mellitus without complications: Secondary | ICD-10-CM | POA: Diagnosis not present

## 2015-07-14 DIAGNOSIS — H401132 Primary open-angle glaucoma, bilateral, moderate stage: Secondary | ICD-10-CM | POA: Diagnosis not present

## 2015-07-14 DIAGNOSIS — E113411 Type 2 diabetes mellitus with severe nonproliferative diabetic retinopathy with macular edema, right eye: Secondary | ICD-10-CM | POA: Diagnosis not present

## 2015-07-14 DIAGNOSIS — H31019 Macula scars of posterior pole (postinflammatory) (post-traumatic), unspecified eye: Secondary | ICD-10-CM | POA: Diagnosis not present

## 2015-07-20 DIAGNOSIS — H52221 Regular astigmatism, right eye: Secondary | ICD-10-CM | POA: Diagnosis not present

## 2015-07-20 DIAGNOSIS — E119 Type 2 diabetes mellitus without complications: Secondary | ICD-10-CM | POA: Diagnosis not present

## 2015-07-20 DIAGNOSIS — Z961 Presence of intraocular lens: Secondary | ICD-10-CM | POA: Diagnosis not present

## 2015-07-20 DIAGNOSIS — H401112 Primary open-angle glaucoma, right eye, moderate stage: Secondary | ICD-10-CM | POA: Diagnosis not present

## 2015-11-14 DIAGNOSIS — H401133 Primary open-angle glaucoma, bilateral, severe stage: Secondary | ICD-10-CM | POA: Diagnosis not present

## 2016-01-09 ENCOUNTER — Ambulatory Visit (INDEPENDENT_AMBULATORY_CARE_PROVIDER_SITE_OTHER): Payer: Medicare HMO | Admitting: Ophthalmology

## 2016-01-09 DIAGNOSIS — H338 Other retinal detachments: Secondary | ICD-10-CM

## 2016-01-09 DIAGNOSIS — H35371 Puckering of macula, right eye: Secondary | ICD-10-CM

## 2016-01-09 DIAGNOSIS — H43813 Vitreous degeneration, bilateral: Secondary | ICD-10-CM | POA: Diagnosis not present

## 2016-01-09 DIAGNOSIS — H4423 Degenerative myopia, bilateral: Secondary | ICD-10-CM | POA: Diagnosis not present

## 2016-01-09 DIAGNOSIS — H2702 Aphakia, left eye: Secondary | ICD-10-CM | POA: Diagnosis not present

## 2016-01-09 DIAGNOSIS — H59031 Cystoid macular edema following cataract surgery, right eye: Secondary | ICD-10-CM | POA: Diagnosis not present

## 2016-04-01 DIAGNOSIS — E7800 Pure hypercholesterolemia, unspecified: Secondary | ICD-10-CM | POA: Diagnosis not present

## 2016-04-01 DIAGNOSIS — E119 Type 2 diabetes mellitus without complications: Secondary | ICD-10-CM | POA: Diagnosis not present

## 2016-04-01 DIAGNOSIS — I10 Essential (primary) hypertension: Secondary | ICD-10-CM | POA: Diagnosis not present

## 2016-04-04 DIAGNOSIS — E119 Type 2 diabetes mellitus without complications: Secondary | ICD-10-CM | POA: Diagnosis not present

## 2016-04-04 DIAGNOSIS — I10 Essential (primary) hypertension: Secondary | ICD-10-CM | POA: Diagnosis not present

## 2016-04-04 DIAGNOSIS — E7800 Pure hypercholesterolemia, unspecified: Secondary | ICD-10-CM | POA: Diagnosis not present

## 2016-05-22 DIAGNOSIS — H401132 Primary open-angle glaucoma, bilateral, moderate stage: Secondary | ICD-10-CM | POA: Diagnosis not present

## 2016-05-22 DIAGNOSIS — E113413 Type 2 diabetes mellitus with severe nonproliferative diabetic retinopathy with macular edema, bilateral: Secondary | ICD-10-CM | POA: Diagnosis not present

## 2016-05-22 DIAGNOSIS — H31019 Macula scars of posterior pole (postinflammatory) (post-traumatic), unspecified eye: Secondary | ICD-10-CM | POA: Diagnosis not present

## 2016-07-11 ENCOUNTER — Ambulatory Visit (INDEPENDENT_AMBULATORY_CARE_PROVIDER_SITE_OTHER): Payer: Medicare HMO | Admitting: Ophthalmology

## 2016-07-11 DIAGNOSIS — H338 Other retinal detachments: Secondary | ICD-10-CM

## 2016-07-11 DIAGNOSIS — H4423 Degenerative myopia, bilateral: Secondary | ICD-10-CM

## 2016-07-11 DIAGNOSIS — H59031 Cystoid macular edema following cataract surgery, right eye: Secondary | ICD-10-CM | POA: Diagnosis not present

## 2016-07-11 DIAGNOSIS — H43813 Vitreous degeneration, bilateral: Secondary | ICD-10-CM | POA: Diagnosis not present

## 2016-07-18 DIAGNOSIS — Z23 Encounter for immunization: Secondary | ICD-10-CM | POA: Diagnosis not present

## 2016-09-10 DIAGNOSIS — H401132 Primary open-angle glaucoma, bilateral, moderate stage: Secondary | ICD-10-CM | POA: Diagnosis not present

## 2016-09-10 DIAGNOSIS — H31019 Macula scars of posterior pole (postinflammatory) (post-traumatic), unspecified eye: Secondary | ICD-10-CM | POA: Diagnosis not present

## 2016-09-10 DIAGNOSIS — E113413 Type 2 diabetes mellitus with severe nonproliferative diabetic retinopathy with macular edema, bilateral: Secondary | ICD-10-CM | POA: Diagnosis not present

## 2016-09-12 DIAGNOSIS — E119 Type 2 diabetes mellitus without complications: Secondary | ICD-10-CM | POA: Diagnosis not present

## 2016-09-12 DIAGNOSIS — M79672 Pain in left foot: Secondary | ICD-10-CM | POA: Diagnosis not present

## 2016-09-12 DIAGNOSIS — I10 Essential (primary) hypertension: Secondary | ICD-10-CM | POA: Diagnosis not present

## 2016-09-30 DIAGNOSIS — M109 Gout, unspecified: Secondary | ICD-10-CM | POA: Diagnosis not present

## 2016-09-30 DIAGNOSIS — M79672 Pain in left foot: Secondary | ICD-10-CM | POA: Diagnosis not present

## 2016-09-30 DIAGNOSIS — E790 Hyperuricemia without signs of inflammatory arthritis and tophaceous disease: Secondary | ICD-10-CM | POA: Diagnosis not present

## 2016-10-03 DIAGNOSIS — Z791 Long term (current) use of non-steroidal anti-inflammatories (NSAID): Secondary | ICD-10-CM | POA: Diagnosis not present

## 2016-10-03 DIAGNOSIS — E785 Hyperlipidemia, unspecified: Secondary | ICD-10-CM | POA: Diagnosis not present

## 2016-10-03 DIAGNOSIS — Z7982 Long term (current) use of aspirin: Secondary | ICD-10-CM | POA: Diagnosis not present

## 2016-10-03 DIAGNOSIS — N400 Enlarged prostate without lower urinary tract symptoms: Secondary | ICD-10-CM | POA: Diagnosis not present

## 2016-10-03 DIAGNOSIS — Z809 Family history of malignant neoplasm, unspecified: Secondary | ICD-10-CM | POA: Diagnosis not present

## 2016-10-03 DIAGNOSIS — H409 Unspecified glaucoma: Secondary | ICD-10-CM | POA: Diagnosis not present

## 2016-10-03 DIAGNOSIS — H5462 Unqualified visual loss, left eye, normal vision right eye: Secondary | ICD-10-CM | POA: Diagnosis not present

## 2016-10-03 DIAGNOSIS — N529 Male erectile dysfunction, unspecified: Secondary | ICD-10-CM | POA: Diagnosis not present

## 2016-10-03 DIAGNOSIS — I872 Venous insufficiency (chronic) (peripheral): Secondary | ICD-10-CM | POA: Diagnosis not present

## 2016-10-03 DIAGNOSIS — R030 Elevated blood-pressure reading, without diagnosis of hypertension: Secondary | ICD-10-CM | POA: Diagnosis not present

## 2017-01-06 DIAGNOSIS — E7800 Pure hypercholesterolemia, unspecified: Secondary | ICD-10-CM | POA: Diagnosis not present

## 2017-01-06 DIAGNOSIS — E119 Type 2 diabetes mellitus without complications: Secondary | ICD-10-CM | POA: Diagnosis not present

## 2017-01-06 DIAGNOSIS — Z0000 Encounter for general adult medical examination without abnormal findings: Secondary | ICD-10-CM | POA: Diagnosis not present

## 2017-01-06 DIAGNOSIS — Z125 Encounter for screening for malignant neoplasm of prostate: Secondary | ICD-10-CM | POA: Diagnosis not present

## 2017-01-10 ENCOUNTER — Ambulatory Visit (INDEPENDENT_AMBULATORY_CARE_PROVIDER_SITE_OTHER): Payer: Medicare HMO | Admitting: Ophthalmology

## 2017-01-10 DIAGNOSIS — H338 Other retinal detachments: Secondary | ICD-10-CM | POA: Diagnosis not present

## 2017-01-10 DIAGNOSIS — H43813 Vitreous degeneration, bilateral: Secondary | ICD-10-CM | POA: Diagnosis not present

## 2017-01-10 DIAGNOSIS — H4423 Degenerative myopia, bilateral: Secondary | ICD-10-CM

## 2017-01-10 DIAGNOSIS — H35371 Puckering of macula, right eye: Secondary | ICD-10-CM

## 2017-01-10 DIAGNOSIS — H59031 Cystoid macular edema following cataract surgery, right eye: Secondary | ICD-10-CM

## 2017-01-10 DIAGNOSIS — H2702 Aphakia, left eye: Secondary | ICD-10-CM | POA: Diagnosis not present

## 2017-01-13 DIAGNOSIS — I10 Essential (primary) hypertension: Secondary | ICD-10-CM | POA: Diagnosis not present

## 2017-01-13 DIAGNOSIS — E7800 Pure hypercholesterolemia, unspecified: Secondary | ICD-10-CM | POA: Diagnosis not present

## 2017-01-13 DIAGNOSIS — Z0001 Encounter for general adult medical examination with abnormal findings: Secondary | ICD-10-CM | POA: Diagnosis not present

## 2017-01-13 DIAGNOSIS — E119 Type 2 diabetes mellitus without complications: Secondary | ICD-10-CM | POA: Diagnosis not present

## 2017-02-04 DIAGNOSIS — E113413 Type 2 diabetes mellitus with severe nonproliferative diabetic retinopathy with macular edema, bilateral: Secondary | ICD-10-CM | POA: Diagnosis not present

## 2017-02-04 DIAGNOSIS — H401132 Primary open-angle glaucoma, bilateral, moderate stage: Secondary | ICD-10-CM | POA: Diagnosis not present

## 2017-02-04 DIAGNOSIS — H31013 Macula scars of posterior pole (postinflammatory) (post-traumatic), bilateral: Secondary | ICD-10-CM | POA: Diagnosis not present

## 2017-03-05 DIAGNOSIS — R351 Nocturia: Secondary | ICD-10-CM | POA: Diagnosis not present

## 2017-03-05 DIAGNOSIS — N401 Enlarged prostate with lower urinary tract symptoms: Secondary | ICD-10-CM | POA: Diagnosis not present

## 2017-03-05 DIAGNOSIS — N500 Atrophy of testis: Secondary | ICD-10-CM | POA: Diagnosis not present

## 2017-03-05 DIAGNOSIS — R3121 Asymptomatic microscopic hematuria: Secondary | ICD-10-CM | POA: Diagnosis not present

## 2017-03-12 DIAGNOSIS — R3121 Asymptomatic microscopic hematuria: Secondary | ICD-10-CM | POA: Diagnosis not present

## 2017-03-12 DIAGNOSIS — N401 Enlarged prostate with lower urinary tract symptoms: Secondary | ICD-10-CM | POA: Diagnosis not present

## 2017-03-13 ENCOUNTER — Other Ambulatory Visit: Payer: Self-pay | Admitting: Urology

## 2017-03-13 DIAGNOSIS — K862 Cyst of pancreas: Secondary | ICD-10-CM

## 2017-04-01 ENCOUNTER — Ambulatory Visit
Admission: RE | Admit: 2017-04-01 | Discharge: 2017-04-01 | Disposition: A | Discharge status: Home / Self Care | Payer: Medicare HMO | Source: Ambulatory Visit | Attending: Urology | Admitting: Urology

## 2017-04-01 DIAGNOSIS — K5730 Diverticulosis of large intestine without perforation or abscess without bleeding: Secondary | ICD-10-CM | POA: Diagnosis not present

## 2017-04-01 DIAGNOSIS — K862 Cyst of pancreas: Secondary | ICD-10-CM

## 2017-04-01 MED ORDER — GADOBENATE DIMEGLUMINE 529 MG/ML IV SOLN
20.0000 mL | Freq: Once | INTRAVENOUS | Status: AC | PRN
  Administered 2017-04-01: 20 mL via INTRAVENOUS

## 2017-04-17 DIAGNOSIS — N500 Atrophy of testis: Secondary | ICD-10-CM | POA: Diagnosis not present

## 2017-04-23 DIAGNOSIS — N401 Enlarged prostate with lower urinary tract symptoms: Secondary | ICD-10-CM | POA: Diagnosis not present

## 2017-04-23 DIAGNOSIS — N500 Atrophy of testis: Secondary | ICD-10-CM | POA: Diagnosis not present

## 2017-04-23 DIAGNOSIS — N270 Small kidney, unilateral: Secondary | ICD-10-CM | POA: Diagnosis not present

## 2017-04-23 DIAGNOSIS — R3121 Asymptomatic microscopic hematuria: Secondary | ICD-10-CM | POA: Diagnosis not present

## 2017-04-23 DIAGNOSIS — R351 Nocturia: Secondary | ICD-10-CM | POA: Diagnosis not present

## 2017-05-13 DIAGNOSIS — H31012 Macula scars of posterior pole (postinflammatory) (post-traumatic), left eye: Secondary | ICD-10-CM | POA: Diagnosis not present

## 2017-05-13 DIAGNOSIS — H401132 Primary open-angle glaucoma, bilateral, moderate stage: Secondary | ICD-10-CM | POA: Diagnosis not present

## 2017-05-19 DIAGNOSIS — E119 Type 2 diabetes mellitus without complications: Secondary | ICD-10-CM | POA: Diagnosis not present

## 2017-05-19 DIAGNOSIS — N401 Enlarged prostate with lower urinary tract symptoms: Secondary | ICD-10-CM | POA: Diagnosis not present

## 2017-05-19 DIAGNOSIS — E782 Mixed hyperlipidemia: Secondary | ICD-10-CM | POA: Diagnosis not present

## 2017-05-19 DIAGNOSIS — M7742 Metatarsalgia, left foot: Secondary | ICD-10-CM | POA: Diagnosis not present

## 2017-06-24 DIAGNOSIS — Z23 Encounter for immunization: Secondary | ICD-10-CM | POA: Diagnosis not present

## 2017-07-15 ENCOUNTER — Ambulatory Visit (INDEPENDENT_AMBULATORY_CARE_PROVIDER_SITE_OTHER): Payer: Medicare HMO | Admitting: Ophthalmology

## 2017-07-15 DIAGNOSIS — H338 Other retinal detachments: Secondary | ICD-10-CM | POA: Diagnosis not present

## 2017-07-15 DIAGNOSIS — H43813 Vitreous degeneration, bilateral: Secondary | ICD-10-CM

## 2017-07-15 DIAGNOSIS — H4423 Degenerative myopia, bilateral: Secondary | ICD-10-CM | POA: Diagnosis not present

## 2017-07-15 DIAGNOSIS — H59031 Cystoid macular edema following cataract surgery, right eye: Secondary | ICD-10-CM | POA: Diagnosis not present

## 2017-08-28 DIAGNOSIS — H2702 Aphakia, left eye: Secondary | ICD-10-CM | POA: Diagnosis not present

## 2017-08-28 DIAGNOSIS — H31012 Macula scars of posterior pole (postinflammatory) (post-traumatic), left eye: Secondary | ICD-10-CM | POA: Diagnosis not present

## 2017-08-28 DIAGNOSIS — H401132 Primary open-angle glaucoma, bilateral, moderate stage: Secondary | ICD-10-CM | POA: Diagnosis not present

## 2017-10-02 DIAGNOSIS — E119 Type 2 diabetes mellitus without complications: Secondary | ICD-10-CM | POA: Diagnosis not present

## 2017-10-02 DIAGNOSIS — H409 Unspecified glaucoma: Secondary | ICD-10-CM | POA: Diagnosis not present

## 2017-10-02 DIAGNOSIS — Z833 Family history of diabetes mellitus: Secondary | ICD-10-CM | POA: Diagnosis not present

## 2017-10-02 DIAGNOSIS — R32 Unspecified urinary incontinence: Secondary | ICD-10-CM | POA: Diagnosis not present

## 2017-10-02 DIAGNOSIS — Z87891 Personal history of nicotine dependence: Secondary | ICD-10-CM | POA: Diagnosis not present

## 2017-10-02 DIAGNOSIS — Z8249 Family history of ischemic heart disease and other diseases of the circulatory system: Secondary | ICD-10-CM | POA: Diagnosis not present

## 2017-10-02 DIAGNOSIS — N400 Enlarged prostate without lower urinary tract symptoms: Secondary | ICD-10-CM | POA: Diagnosis not present

## 2017-10-02 DIAGNOSIS — N529 Male erectile dysfunction, unspecified: Secondary | ICD-10-CM | POA: Diagnosis not present

## 2017-10-02 DIAGNOSIS — E7800 Pure hypercholesterolemia, unspecified: Secondary | ICD-10-CM | POA: Diagnosis not present

## 2017-10-02 DIAGNOSIS — H5462 Unqualified visual loss, left eye, normal vision right eye: Secondary | ICD-10-CM | POA: Diagnosis not present

## 2017-11-27 DIAGNOSIS — H31012 Macula scars of posterior pole (postinflammatory) (post-traumatic), left eye: Secondary | ICD-10-CM | POA: Diagnosis not present

## 2017-11-27 DIAGNOSIS — H401132 Primary open-angle glaucoma, bilateral, moderate stage: Secondary | ICD-10-CM | POA: Diagnosis not present

## 2017-11-27 DIAGNOSIS — H2702 Aphakia, left eye: Secondary | ICD-10-CM | POA: Diagnosis not present

## 2018-01-19 ENCOUNTER — Encounter (INDEPENDENT_AMBULATORY_CARE_PROVIDER_SITE_OTHER): Payer: Medicare HMO | Admitting: Ophthalmology

## 2018-01-19 DIAGNOSIS — H59031 Cystoid macular edema following cataract surgery, right eye: Secondary | ICD-10-CM

## 2018-01-19 DIAGNOSIS — H35371 Puckering of macula, right eye: Secondary | ICD-10-CM | POA: Diagnosis not present

## 2018-01-19 DIAGNOSIS — H43813 Vitreous degeneration, bilateral: Secondary | ICD-10-CM | POA: Diagnosis not present

## 2018-01-19 DIAGNOSIS — H4423 Degenerative myopia, bilateral: Secondary | ICD-10-CM | POA: Diagnosis not present

## 2018-02-21 IMAGING — MR MR ABDOMEN WO/W CM
16 of 19 series · 40 of 48 positions shown · IV contrast (20 ml multihance)
Comparison: 03/12/2017 CT abdomen/pelvis.

CLINICAL DATA: Evaluate pancreatic neck cystic lesion incidentally
detected on recent CT abdomen/pelvis study performed for
microhematuria.

EXAM:
MRI ABDOMEN WITHOUT AND WITH CONTRAST
TECHNIQUE: Multiplanar multisequence MR imaging of the abdomen was performed
both before and after the administration of intravenous contrast.
CONTRAST:  20mL MULTIHANCE GADOBENATE DIMEGLUMINE 529 MG/ML IV SOLN

[Series 3: T2 · coronal · 5.0mm · 1.56mm/px · 1 of 36 slices shown (1 of 3)]
[im 1/36]
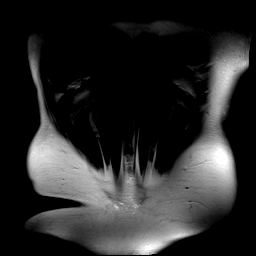

[Series 4: T1 · axial · 3.0mm · 1.19mm/px · z∈[-63,+150]mm · 5 of 144 slices shown]
[im 1/144]
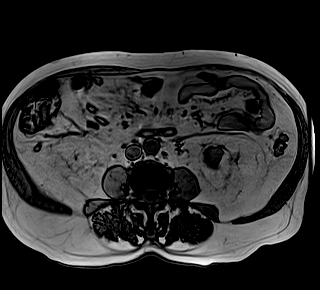
[im 36/144]
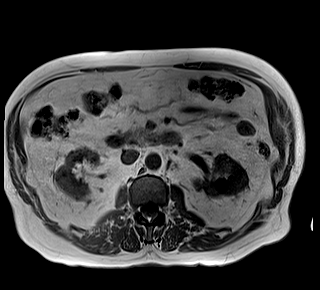
[im 72/144]
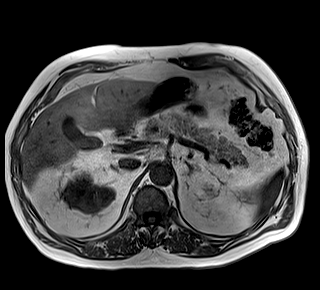
[im 108/144]
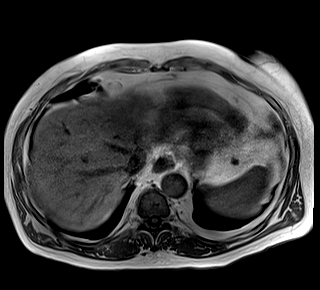
[im 144/144]
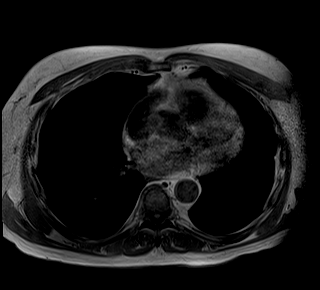

[Series 8: T2 · coronal · 3.0mm · 1.19mm/px · 1 of 19 slices shown (2 of 3)]
[im 1/19]
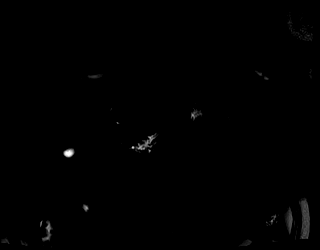

[Series 10: MRCP · coronal · 1.0mm · 0.49mm/px · 2 of 64 slices shown]
[im 1/64]
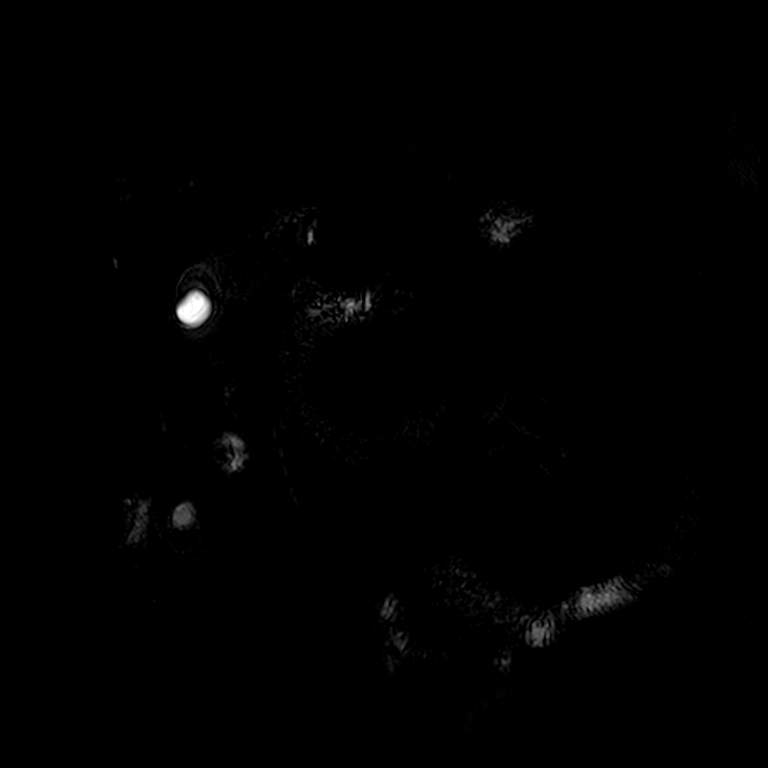
[im 64/64]
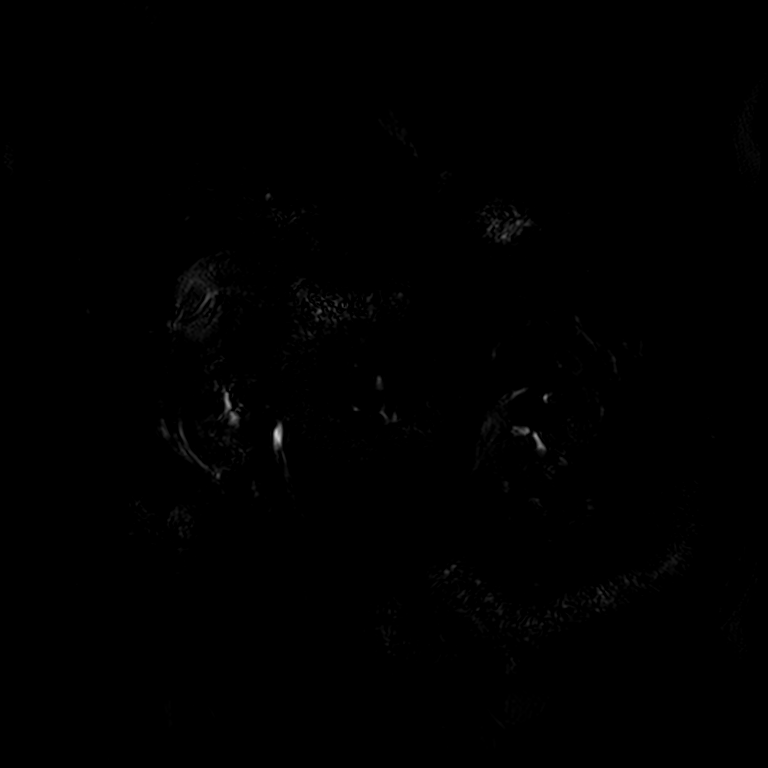

[Series 13: DWI · axial · 5.0mm · 1.42mm/px · z∈[-45,+165]mm · 4 of 108 slices shown (1 of 2)]
[im 1/108]
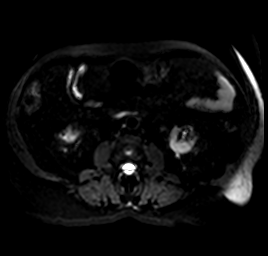
[im 36/108]
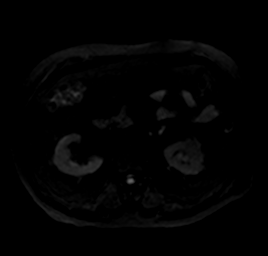
[im 72/108]
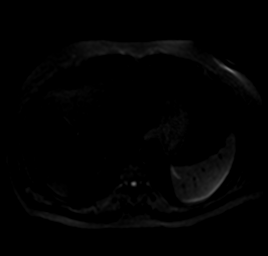
[im 108/108]
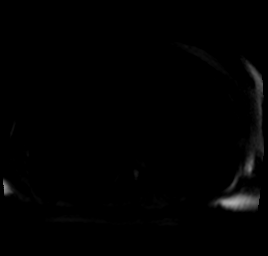

[Series 14: DWI · axial · 5.0mm · 1.42mm/px · 1 of 36 slices shown (2 of 2)]
[im 1/36]
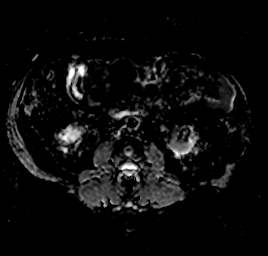

[Series 15: T2 · axial · 6.0mm · 1.22mm/px · 1 of 30 slices shown (3 of 3)]
[im 1/30]
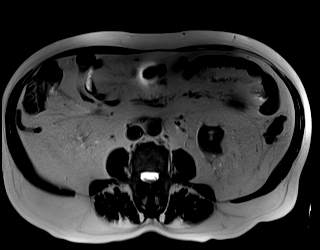

[Series 16: bSSFP · axial · 5.0mm · 1.25mm/px · z∈[-68,+154]mm · 2 of 38 slices shown]
[im 1/38]
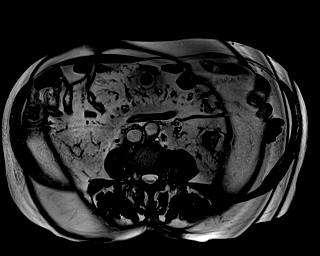
[im 38/38]
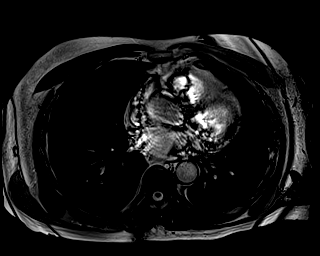

[Series 17: T1 dynamic · axial · non-contrast · 3.0mm · 1.25mm/px · z∈[-63,+150]mm · 3 of 72 slices shown]
[im 1/72]
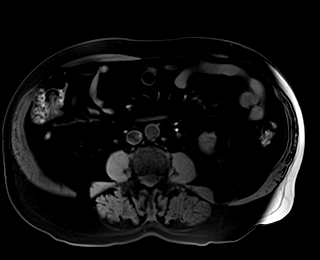
[im 36/72]
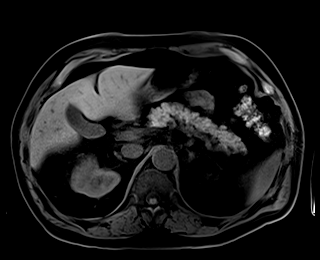
[im 72/72]
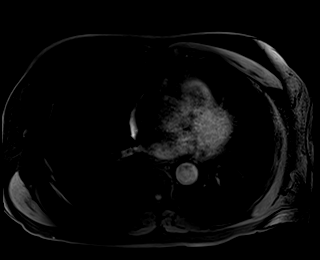

[Series 18: T1 dynamic post-contrast · axial · 3.0mm · 1.25mm/px · z∈[-63,+150]mm · 3 of 72 slices shown (1 of 7)]
[im 1/72]
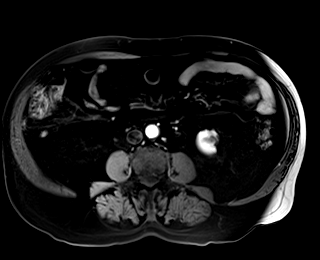
[im 36/72]
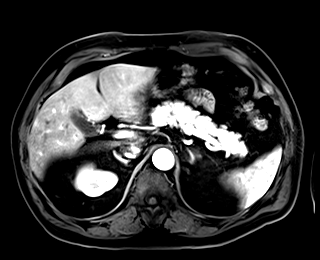
[im 72/72]
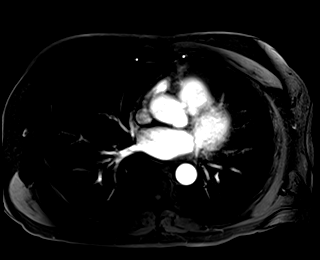

[Series 19: T1 dynamic post-contrast · axial · 3.0mm · 1.25mm/px · z∈[-63,+150]mm · 3 of 72 slices shown (2 of 7)]
[im 1/72]
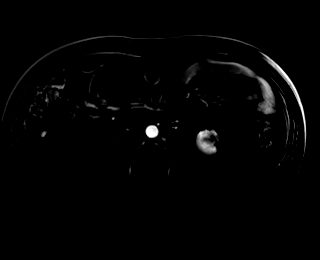
[im 36/72]
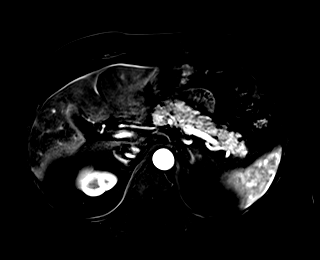
[im 72/72]
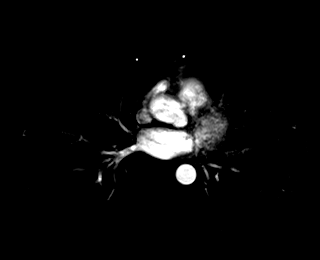

[Series 20: T1 dynamic post-contrast · axial · 3.0mm · 1.25mm/px · z∈[-63,+150]mm · 3 of 72 slices shown (3 of 7)]
[im 1/72]
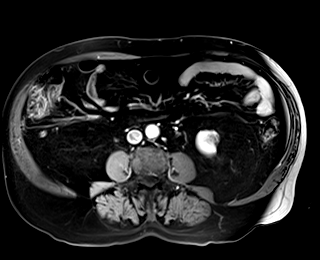
[im 36/72]
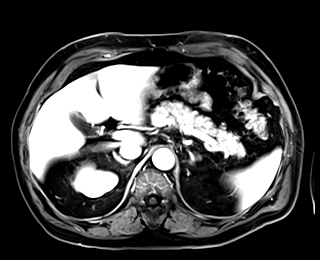
[im 72/72]
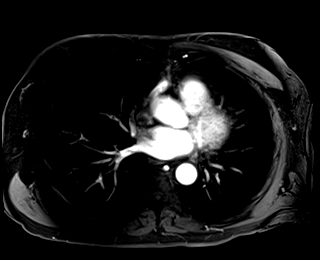

[Series 21: T1 dynamic post-contrast · axial · 3.0mm · 1.25mm/px · z∈[-63,+150]mm · 3 of 72 slices shown (4 of 7)]
[im 1/72]
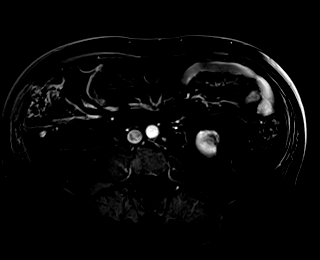
[im 36/72]
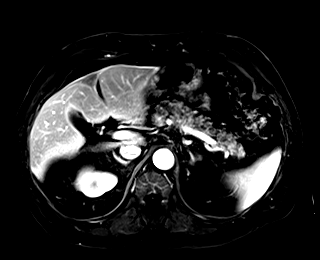
[im 72/72]
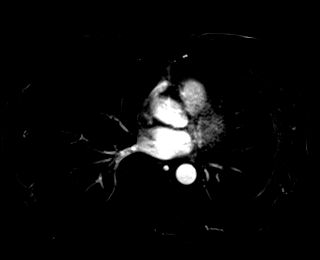

[Series 22: T1 dynamic post-contrast · axial · 3.0mm · 1.25mm/px · z∈[-63,+150]mm · 3 of 72 slices shown (5 of 7)]
[im 1/72]
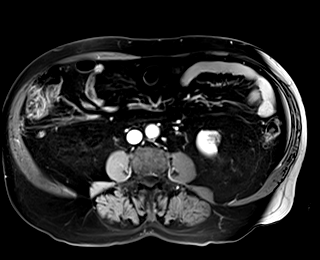
[im 36/72]
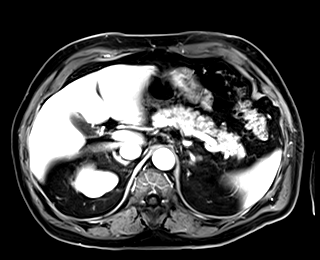
[im 72/72]
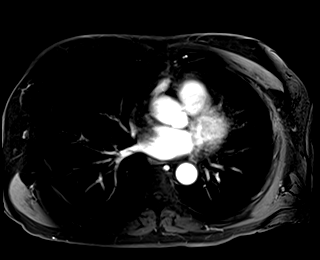

[Series 23: T1 dynamic post-contrast · axial · 3.0mm · 1.25mm/px · z∈[-63,+150]mm · 3 of 72 slices shown (6 of 7)]
[im 1/72]
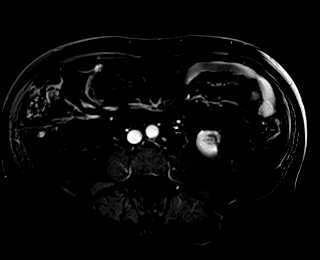
[im 36/72]
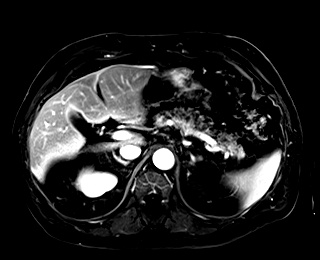
[im 72/72]
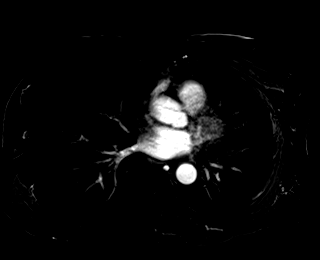

[Series 24: T1 dynamic post-contrast · coronal · 3.0mm · 1.25mm/px · 2 of 72 slices shown (7 of 7)]
[im 1/72]
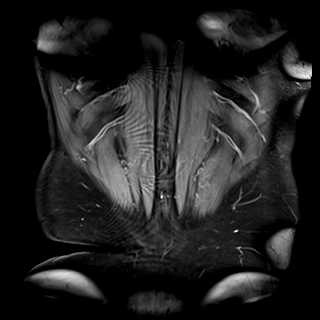
[im 36/72]
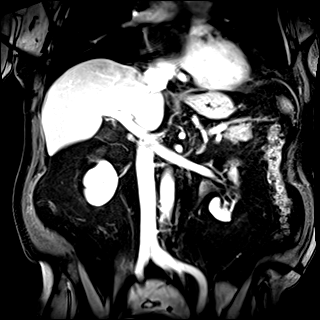

[40 of 48 positions shown; findings below may reference images not displayed]

FINDINGS: Lower chest: No acute abnormality at the lung bases.

Hepatobiliary: Normal liver size and configuration. No hepatic
steatosis. No liver mass. Normal gallbladder with no cholelithiasis.
No biliary ductal dilatation. Common bile duct diameter 2 mm. No
choledocholithiasis.

Pancreas: There is a 1.1 x 0.8 x 1.0 cm unilocular cystic posterior
pancreatic neck lesion (series 15/image 17), which demonstrates no
solid enhancement, wall thickening or internal septations and does
not clearly communicate with the pancreatic duct on the MRCP
sequence. No additional pancreatic lesions. No pancreatic duct
dilation. No pancreas divisum.

Spleen: Normal size. No mass.

Adrenals/Urinary Tract: Normal adrenals. No hydronephrosis. Stable
moderate renal cortical scarring in the lower left kidney. Stable
mild renal cortical scarring in the anterior interpolar right
kidney. Tiny 0.4 cm exophytic renal cortical lesion in the lateral
upper right kidney with precontrast T1 hyperintensity (series 17/
image 41) and T2 hypointensity, too small to assess for enhancement.
Several small simple renal cysts in both kidneys, largest 1.9 cm in
the interpolar left kidney.

Stomach/Bowel: Tiny hiatal hernia. Otherwise collapsed and grossly
normal stomach. Visualized small and large bowel is normal caliber,
with no bowel wall thickening. Diffuse colonic diverticulosis.

Vascular/Lymphatic: Atherosclerotic nonaneurysmal abdominal aorta.
Patent portal, splenic, hepatic and renal veins. No pathologically
enlarged lymph nodes in the abdomen.

Other: No abdominal ascites or focal fluid collection.

Musculoskeletal: No aggressive appearing focal osseous lesions.
IMPRESSION: 1. Nonenhancing 1.1 cm cystic pancreatic neck lesion without high
risk MRI features. Follow-up MRI abdomen without and with IV
contrast recommended every 2 years for total of 10 years. This
recommendation follows ACR consensus guidelines: Management of
Incidental Pancreatic Cysts: A White Paper of the ACR Incidental
Findings Committee. [HOSPITAL] 9496;[DATE].
2. Tiny hemorrhagic 0.4 cm renal cortical lesion in the lateral
upper right kidney, too small to accurately characterize, probably a
Bosniak category 2 renal cyst, which can be reassessed on the
follow-up MRI abdomen in 2 years.
3. Tiny hiatal hernia.
4. Diffuse colonic diverticulosis.
5.  Aortic Atherosclerosis ([id number]).

## 2018-03-05 DIAGNOSIS — E113411 Type 2 diabetes mellitus with severe nonproliferative diabetic retinopathy with macular edema, right eye: Secondary | ICD-10-CM | POA: Diagnosis not present

## 2018-03-05 DIAGNOSIS — H31012 Macula scars of posterior pole (postinflammatory) (post-traumatic), left eye: Secondary | ICD-10-CM | POA: Diagnosis not present

## 2018-03-05 DIAGNOSIS — H401132 Primary open-angle glaucoma, bilateral, moderate stage: Secondary | ICD-10-CM | POA: Diagnosis not present

## 2018-03-24 ENCOUNTER — Encounter (INDEPENDENT_AMBULATORY_CARE_PROVIDER_SITE_OTHER): Payer: Medicare HMO | Admitting: Ophthalmology

## 2018-03-24 DIAGNOSIS — H35371 Puckering of macula, right eye: Secondary | ICD-10-CM

## 2018-03-24 DIAGNOSIS — H43813 Vitreous degeneration, bilateral: Secondary | ICD-10-CM | POA: Diagnosis not present

## 2018-03-24 DIAGNOSIS — H4423 Degenerative myopia, bilateral: Secondary | ICD-10-CM

## 2018-03-24 DIAGNOSIS — H59031 Cystoid macular edema following cataract surgery, right eye: Secondary | ICD-10-CM

## 2018-04-07 DIAGNOSIS — E7800 Pure hypercholesterolemia, unspecified: Secondary | ICD-10-CM | POA: Diagnosis not present

## 2018-04-07 DIAGNOSIS — Z125 Encounter for screening for malignant neoplasm of prostate: Secondary | ICD-10-CM | POA: Diagnosis not present

## 2018-04-07 DIAGNOSIS — E119 Type 2 diabetes mellitus without complications: Secondary | ICD-10-CM | POA: Diagnosis not present

## 2018-04-13 DIAGNOSIS — N400 Enlarged prostate without lower urinary tract symptoms: Secondary | ICD-10-CM | POA: Diagnosis not present

## 2018-04-13 DIAGNOSIS — E119 Type 2 diabetes mellitus without complications: Secondary | ICD-10-CM | POA: Diagnosis not present

## 2018-04-13 DIAGNOSIS — R351 Nocturia: Secondary | ICD-10-CM | POA: Diagnosis not present

## 2018-04-13 DIAGNOSIS — I10 Essential (primary) hypertension: Secondary | ICD-10-CM | POA: Diagnosis not present

## 2018-04-13 DIAGNOSIS — D72819 Decreased white blood cell count, unspecified: Secondary | ICD-10-CM | POA: Diagnosis not present

## 2018-04-13 DIAGNOSIS — R5383 Other fatigue: Secondary | ICD-10-CM | POA: Diagnosis not present

## 2018-04-13 DIAGNOSIS — E782 Mixed hyperlipidemia: Secondary | ICD-10-CM | POA: Diagnosis not present

## 2018-04-13 DIAGNOSIS — J449 Chronic obstructive pulmonary disease, unspecified: Secondary | ICD-10-CM | POA: Diagnosis not present

## 2018-04-13 DIAGNOSIS — G4700 Insomnia, unspecified: Secondary | ICD-10-CM | POA: Diagnosis not present

## 2018-04-13 DIAGNOSIS — Z0001 Encounter for general adult medical examination with abnormal findings: Secondary | ICD-10-CM | POA: Diagnosis not present

## 2018-06-03 DIAGNOSIS — Z23 Encounter for immunization: Secondary | ICD-10-CM | POA: Diagnosis not present

## 2018-06-24 ENCOUNTER — Encounter (INDEPENDENT_AMBULATORY_CARE_PROVIDER_SITE_OTHER): Payer: Medicare HMO | Admitting: Ophthalmology

## 2018-06-24 DIAGNOSIS — H59031 Cystoid macular edema following cataract surgery, right eye: Secondary | ICD-10-CM | POA: Diagnosis not present

## 2018-06-24 DIAGNOSIS — H2702 Aphakia, left eye: Secondary | ICD-10-CM | POA: Diagnosis not present

## 2018-06-24 DIAGNOSIS — H33301 Unspecified retinal break, right eye: Secondary | ICD-10-CM | POA: Diagnosis not present

## 2018-06-24 DIAGNOSIS — H338 Other retinal detachments: Secondary | ICD-10-CM

## 2018-06-24 DIAGNOSIS — H35371 Puckering of macula, right eye: Secondary | ICD-10-CM | POA: Diagnosis not present

## 2018-07-06 DIAGNOSIS — I10 Essential (primary) hypertension: Secondary | ICD-10-CM | POA: Diagnosis not present

## 2018-07-06 DIAGNOSIS — E7800 Pure hypercholesterolemia, unspecified: Secondary | ICD-10-CM | POA: Diagnosis not present

## 2018-07-06 DIAGNOSIS — Z125 Encounter for screening for malignant neoplasm of prostate: Secondary | ICD-10-CM | POA: Diagnosis not present

## 2018-07-06 DIAGNOSIS — E119 Type 2 diabetes mellitus without complications: Secondary | ICD-10-CM | POA: Diagnosis not present

## 2018-07-09 DIAGNOSIS — E113411 Type 2 diabetes mellitus with severe nonproliferative diabetic retinopathy with macular edema, right eye: Secondary | ICD-10-CM | POA: Diagnosis not present

## 2018-07-09 DIAGNOSIS — H31012 Macula scars of posterior pole (postinflammatory) (post-traumatic), left eye: Secondary | ICD-10-CM | POA: Diagnosis not present

## 2018-07-09 DIAGNOSIS — H401132 Primary open-angle glaucoma, bilateral, moderate stage: Secondary | ICD-10-CM | POA: Diagnosis not present

## 2018-10-08 DIAGNOSIS — E7800 Pure hypercholesterolemia, unspecified: Secondary | ICD-10-CM | POA: Diagnosis not present

## 2018-10-08 DIAGNOSIS — E119 Type 2 diabetes mellitus without complications: Secondary | ICD-10-CM | POA: Diagnosis not present

## 2018-10-26 ENCOUNTER — Encounter (INDEPENDENT_AMBULATORY_CARE_PROVIDER_SITE_OTHER): Payer: Medicare HMO | Admitting: Ophthalmology

## 2018-10-28 ENCOUNTER — Encounter (INDEPENDENT_AMBULATORY_CARE_PROVIDER_SITE_OTHER): Payer: Medicare HMO | Admitting: Ophthalmology

## 2018-10-28 DIAGNOSIS — H33301 Unspecified retinal break, right eye: Secondary | ICD-10-CM | POA: Diagnosis not present

## 2018-10-28 DIAGNOSIS — H338 Other retinal detachments: Secondary | ICD-10-CM | POA: Diagnosis not present

## 2018-10-28 DIAGNOSIS — H4423 Degenerative myopia, bilateral: Secondary | ICD-10-CM

## 2018-10-28 DIAGNOSIS — H35371 Puckering of macula, right eye: Secondary | ICD-10-CM | POA: Diagnosis not present

## 2018-10-28 DIAGNOSIS — H59031 Cystoid macular edema following cataract surgery, right eye: Secondary | ICD-10-CM | POA: Diagnosis not present

## 2018-10-28 DIAGNOSIS — H43813 Vitreous degeneration, bilateral: Secondary | ICD-10-CM

## 2019-02-26 ENCOUNTER — Encounter (INDEPENDENT_AMBULATORY_CARE_PROVIDER_SITE_OTHER): Payer: Medicare HMO | Admitting: Ophthalmology

## 2019-02-26 ENCOUNTER — Other Ambulatory Visit: Payer: Self-pay

## 2019-02-26 DIAGNOSIS — H35371 Puckering of macula, right eye: Secondary | ICD-10-CM

## 2019-02-26 DIAGNOSIS — H59031 Cystoid macular edema following cataract surgery, right eye: Secondary | ICD-10-CM

## 2019-02-26 DIAGNOSIS — H33301 Unspecified retinal break, right eye: Secondary | ICD-10-CM | POA: Diagnosis not present

## 2019-02-26 DIAGNOSIS — H43813 Vitreous degeneration, bilateral: Secondary | ICD-10-CM

## 2019-02-26 DIAGNOSIS — H2702 Aphakia, left eye: Secondary | ICD-10-CM | POA: Diagnosis not present

## 2019-02-26 DIAGNOSIS — H338 Other retinal detachments: Secondary | ICD-10-CM | POA: Diagnosis not present

## 2019-04-14 DIAGNOSIS — R972 Elevated prostate specific antigen [PSA]: Secondary | ICD-10-CM | POA: Diagnosis not present

## 2019-04-14 DIAGNOSIS — E119 Type 2 diabetes mellitus without complications: Secondary | ICD-10-CM | POA: Diagnosis not present

## 2019-04-20 DIAGNOSIS — Z7982 Long term (current) use of aspirin: Secondary | ICD-10-CM | POA: Diagnosis not present

## 2019-04-20 DIAGNOSIS — N529 Male erectile dysfunction, unspecified: Secondary | ICD-10-CM | POA: Diagnosis not present

## 2019-04-20 DIAGNOSIS — E118 Type 2 diabetes mellitus with unspecified complications: Secondary | ICD-10-CM | POA: Diagnosis not present

## 2019-04-20 DIAGNOSIS — Z0001 Encounter for general adult medical examination with abnormal findings: Secondary | ICD-10-CM | POA: Diagnosis not present

## 2019-04-20 DIAGNOSIS — I10 Essential (primary) hypertension: Secondary | ICD-10-CM | POA: Diagnosis not present

## 2019-04-20 DIAGNOSIS — E7800 Pure hypercholesterolemia, unspecified: Secondary | ICD-10-CM | POA: Diagnosis not present

## 2019-04-20 DIAGNOSIS — E782 Mixed hyperlipidemia: Secondary | ICD-10-CM | POA: Diagnosis not present

## 2019-04-20 DIAGNOSIS — D72819 Decreased white blood cell count, unspecified: Secondary | ICD-10-CM | POA: Diagnosis not present

## 2019-04-20 DIAGNOSIS — N400 Enlarged prostate without lower urinary tract symptoms: Secondary | ICD-10-CM | POA: Diagnosis not present

## 2019-04-20 DIAGNOSIS — G4700 Insomnia, unspecified: Secondary | ICD-10-CM | POA: Diagnosis not present

## 2019-04-20 DIAGNOSIS — J449 Chronic obstructive pulmonary disease, unspecified: Secondary | ICD-10-CM | POA: Diagnosis not present

## 2019-05-25 DIAGNOSIS — B029 Zoster without complications: Secondary | ICD-10-CM | POA: Diagnosis not present

## 2019-06-22 DIAGNOSIS — Z23 Encounter for immunization: Secondary | ICD-10-CM | POA: Diagnosis not present

## 2019-06-28 ENCOUNTER — Encounter (INDEPENDENT_AMBULATORY_CARE_PROVIDER_SITE_OTHER): Payer: Medicare HMO | Admitting: Ophthalmology

## 2019-06-28 ENCOUNTER — Other Ambulatory Visit: Payer: Self-pay

## 2019-06-28 DIAGNOSIS — H2702 Aphakia, left eye: Secondary | ICD-10-CM

## 2019-06-28 DIAGNOSIS — H338 Other retinal detachments: Secondary | ICD-10-CM | POA: Diagnosis not present

## 2019-06-28 DIAGNOSIS — H35371 Puckering of macula, right eye: Secondary | ICD-10-CM

## 2019-06-28 DIAGNOSIS — H33301 Unspecified retinal break, right eye: Secondary | ICD-10-CM

## 2019-06-28 DIAGNOSIS — H59031 Cystoid macular edema following cataract surgery, right eye: Secondary | ICD-10-CM

## 2019-06-28 DIAGNOSIS — H43813 Vitreous degeneration, bilateral: Secondary | ICD-10-CM

## 2019-07-07 DIAGNOSIS — H409 Unspecified glaucoma: Secondary | ICD-10-CM | POA: Diagnosis not present

## 2019-07-07 DIAGNOSIS — Z803 Family history of malignant neoplasm of breast: Secondary | ICD-10-CM | POA: Diagnosis not present

## 2019-07-07 DIAGNOSIS — E785 Hyperlipidemia, unspecified: Secondary | ICD-10-CM | POA: Diagnosis not present

## 2019-07-07 DIAGNOSIS — R69 Illness, unspecified: Secondary | ICD-10-CM | POA: Diagnosis not present

## 2019-07-07 DIAGNOSIS — Z8249 Family history of ischemic heart disease and other diseases of the circulatory system: Secondary | ICD-10-CM | POA: Diagnosis not present

## 2019-07-07 DIAGNOSIS — Z809 Family history of malignant neoplasm, unspecified: Secondary | ICD-10-CM | POA: Diagnosis not present

## 2019-07-07 DIAGNOSIS — N529 Male erectile dysfunction, unspecified: Secondary | ICD-10-CM | POA: Diagnosis not present

## 2019-07-07 DIAGNOSIS — R32 Unspecified urinary incontinence: Secondary | ICD-10-CM | POA: Diagnosis not present

## 2019-07-07 DIAGNOSIS — Z7722 Contact with and (suspected) exposure to environmental tobacco smoke (acute) (chronic): Secondary | ICD-10-CM | POA: Diagnosis not present

## 2019-07-07 DIAGNOSIS — N400 Enlarged prostate without lower urinary tract symptoms: Secondary | ICD-10-CM | POA: Diagnosis not present

## 2019-08-14 DIAGNOSIS — Z20828 Contact with and (suspected) exposure to other viral communicable diseases: Secondary | ICD-10-CM | POA: Diagnosis not present

## 2019-09-04 DIAGNOSIS — Z20828 Contact with and (suspected) exposure to other viral communicable diseases: Secondary | ICD-10-CM | POA: Diagnosis not present

## 2019-10-17 ENCOUNTER — Ambulatory Visit: Payer: Medicare HMO

## 2019-10-29 ENCOUNTER — Encounter (INDEPENDENT_AMBULATORY_CARE_PROVIDER_SITE_OTHER): Payer: Medicare HMO | Admitting: Ophthalmology

## 2019-10-29 ENCOUNTER — Other Ambulatory Visit: Payer: Self-pay

## 2019-10-29 DIAGNOSIS — I10 Essential (primary) hypertension: Secondary | ICD-10-CM

## 2019-10-29 DIAGNOSIS — H59031 Cystoid macular edema following cataract surgery, right eye: Secondary | ICD-10-CM

## 2019-10-29 DIAGNOSIS — H43813 Vitreous degeneration, bilateral: Secondary | ICD-10-CM

## 2019-10-29 DIAGNOSIS — H4423 Degenerative myopia, bilateral: Secondary | ICD-10-CM

## 2019-10-29 DIAGNOSIS — H35033 Hypertensive retinopathy, bilateral: Secondary | ICD-10-CM

## 2019-10-29 DIAGNOSIS — H338 Other retinal detachments: Secondary | ICD-10-CM | POA: Diagnosis not present

## 2019-10-29 DIAGNOSIS — H33301 Unspecified retinal break, right eye: Secondary | ICD-10-CM

## 2019-10-29 DIAGNOSIS — H2702 Aphakia, left eye: Secondary | ICD-10-CM

## 2020-02-28 ENCOUNTER — Encounter (INDEPENDENT_AMBULATORY_CARE_PROVIDER_SITE_OTHER): Payer: Medicare HMO | Admitting: Ophthalmology

## 2020-02-28 ENCOUNTER — Other Ambulatory Visit: Payer: Self-pay

## 2020-02-28 DIAGNOSIS — H338 Other retinal detachments: Secondary | ICD-10-CM | POA: Diagnosis not present

## 2020-02-28 DIAGNOSIS — H35371 Puckering of macula, right eye: Secondary | ICD-10-CM | POA: Diagnosis not present

## 2020-02-28 DIAGNOSIS — H59031 Cystoid macular edema following cataract surgery, right eye: Secondary | ICD-10-CM

## 2020-02-28 DIAGNOSIS — H4423 Degenerative myopia, bilateral: Secondary | ICD-10-CM | POA: Diagnosis not present

## 2020-02-28 DIAGNOSIS — H43813 Vitreous degeneration, bilateral: Secondary | ICD-10-CM

## 2020-02-28 DIAGNOSIS — H33301 Unspecified retinal break, right eye: Secondary | ICD-10-CM

## 2020-08-29 ENCOUNTER — Encounter (INDEPENDENT_AMBULATORY_CARE_PROVIDER_SITE_OTHER): Payer: Medicare HMO | Admitting: Ophthalmology

## 2020-08-29 ENCOUNTER — Other Ambulatory Visit: Payer: Self-pay

## 2020-08-29 DIAGNOSIS — H338 Other retinal detachments: Secondary | ICD-10-CM

## 2020-08-29 DIAGNOSIS — H59031 Cystoid macular edema following cataract surgery, right eye: Secondary | ICD-10-CM

## 2020-08-29 DIAGNOSIS — H4423 Degenerative myopia, bilateral: Secondary | ICD-10-CM | POA: Diagnosis not present

## 2020-08-29 DIAGNOSIS — H35371 Puckering of macula, right eye: Secondary | ICD-10-CM | POA: Diagnosis not present

## 2020-08-29 DIAGNOSIS — H33301 Unspecified retinal break, right eye: Secondary | ICD-10-CM

## 2020-08-29 DIAGNOSIS — H43813 Vitreous degeneration, bilateral: Secondary | ICD-10-CM

## 2021-02-27 ENCOUNTER — Encounter (INDEPENDENT_AMBULATORY_CARE_PROVIDER_SITE_OTHER): Payer: Medicare HMO | Admitting: Ophthalmology

## 2021-02-27 ENCOUNTER — Other Ambulatory Visit: Payer: Self-pay

## 2021-02-27 DIAGNOSIS — H35371 Puckering of macula, right eye: Secondary | ICD-10-CM

## 2021-02-27 DIAGNOSIS — H59031 Cystoid macular edema following cataract surgery, right eye: Secondary | ICD-10-CM | POA: Diagnosis not present

## 2021-02-27 DIAGNOSIS — H4423 Degenerative myopia, bilateral: Secondary | ICD-10-CM

## 2021-02-27 DIAGNOSIS — H33301 Unspecified retinal break, right eye: Secondary | ICD-10-CM

## 2021-02-27 DIAGNOSIS — H338 Other retinal detachments: Secondary | ICD-10-CM | POA: Diagnosis not present

## 2021-02-27 DIAGNOSIS — H43813 Vitreous degeneration, bilateral: Secondary | ICD-10-CM

## 2021-08-28 ENCOUNTER — Other Ambulatory Visit: Payer: Self-pay

## 2021-08-28 ENCOUNTER — Encounter (INDEPENDENT_AMBULATORY_CARE_PROVIDER_SITE_OTHER): Payer: Medicare HMO | Admitting: Ophthalmology

## 2021-08-28 DIAGNOSIS — H33301 Unspecified retinal break, right eye: Secondary | ICD-10-CM

## 2021-08-28 DIAGNOSIS — H338 Other retinal detachments: Secondary | ICD-10-CM | POA: Diagnosis not present

## 2021-08-28 DIAGNOSIS — H4423 Degenerative myopia, bilateral: Secondary | ICD-10-CM | POA: Diagnosis not present

## 2021-08-28 DIAGNOSIS — H59031 Cystoid macular edema following cataract surgery, right eye: Secondary | ICD-10-CM

## 2021-08-28 DIAGNOSIS — H35371 Puckering of macula, right eye: Secondary | ICD-10-CM | POA: Diagnosis not present

## 2021-08-28 DIAGNOSIS — H43813 Vitreous degeneration, bilateral: Secondary | ICD-10-CM

## 2022-02-26 ENCOUNTER — Encounter (INDEPENDENT_AMBULATORY_CARE_PROVIDER_SITE_OTHER): Payer: Medicare HMO | Admitting: Ophthalmology

## 2022-02-26 DIAGNOSIS — H59031 Cystoid macular edema following cataract surgery, right eye: Secondary | ICD-10-CM | POA: Diagnosis not present

## 2022-02-26 DIAGNOSIS — H33301 Unspecified retinal break, right eye: Secondary | ICD-10-CM

## 2022-02-26 DIAGNOSIS — H338 Other retinal detachments: Secondary | ICD-10-CM | POA: Diagnosis not present

## 2022-02-26 DIAGNOSIS — H35371 Puckering of macula, right eye: Secondary | ICD-10-CM | POA: Diagnosis not present

## 2022-02-26 DIAGNOSIS — H43813 Vitreous degeneration, bilateral: Secondary | ICD-10-CM

## 2022-05-17 ENCOUNTER — Other Ambulatory Visit: Payer: Self-pay | Admitting: Internal Medicine

## 2022-05-17 DIAGNOSIS — I159 Secondary hypertension, unspecified: Secondary | ICD-10-CM

## 2022-08-08 ENCOUNTER — Other Ambulatory Visit: Payer: Medicare HMO

## 2022-08-26 ENCOUNTER — Other Ambulatory Visit: Payer: Medicare HMO

## 2022-08-27 ENCOUNTER — Encounter (INDEPENDENT_AMBULATORY_CARE_PROVIDER_SITE_OTHER): Payer: Medicare HMO | Admitting: Ophthalmology

## 2022-08-27 DIAGNOSIS — H33301 Unspecified retinal break, right eye: Secondary | ICD-10-CM

## 2022-08-27 DIAGNOSIS — H43813 Vitreous degeneration, bilateral: Secondary | ICD-10-CM

## 2022-08-27 DIAGNOSIS — H35373 Puckering of macula, bilateral: Secondary | ICD-10-CM | POA: Diagnosis not present

## 2022-08-27 DIAGNOSIS — H59031 Cystoid macular edema following cataract surgery, right eye: Secondary | ICD-10-CM

## 2022-08-27 DIAGNOSIS — H338 Other retinal detachments: Secondary | ICD-10-CM

## 2022-09-20 ENCOUNTER — Ambulatory Visit
Admission: RE | Admit: 2022-09-20 | Discharge: 2022-09-20 | Disposition: A | Discharge status: Home / Self Care | Payer: No Typology Code available for payment source | Source: Ambulatory Visit | Attending: Internal Medicine | Admitting: Internal Medicine

## 2022-09-20 DIAGNOSIS — I159 Secondary hypertension, unspecified: Secondary | ICD-10-CM

## 2022-10-22 ENCOUNTER — Encounter: Payer: Self-pay | Admitting: Cardiology

## 2022-10-22 ENCOUNTER — Ambulatory Visit: Payer: Medicare HMO | Admitting: Cardiology

## 2022-10-22 VITALS — BP 140/84 | HR 62 | Resp 16 | Ht 72.0 in | Wt 195.0 lb

## 2022-10-22 DIAGNOSIS — R030 Elevated blood-pressure reading, without diagnosis of hypertension: Secondary | ICD-10-CM

## 2022-10-22 DIAGNOSIS — I7121 Aneurysm of the ascending aorta, without rupture: Secondary | ICD-10-CM

## 2022-10-22 DIAGNOSIS — R931 Abnormal findings on diagnostic imaging of heart and coronary circulation: Secondary | ICD-10-CM

## 2022-10-22 DIAGNOSIS — R739 Hyperglycemia, unspecified: Secondary | ICD-10-CM

## 2022-10-22 NOTE — Progress Notes (Signed)
Primary Physician/Referring:  Deland Pretty, MD  Patient ID: Joseph [name], male    DOB: Feb 13, 1941, 82 y.o.   MRN: [id number]  Chief Complaint  Patient presents with   Coronary Artery Disease   New Patient (Initial Visit)    Referred by Deland Pretty, MD   HPI:    Joseph [name]  is a 82 y.o. Male patient with diet-controlled/pre-diabetes mellitus, hypercholesterolemia, elevated blood pressure without diagnosis of hypertension referred for evaluation of abnormal coronary calcium score, cardiomegaly and ascending aortic aneurysm noted on 09/11/2022.  He is asymptomatic.  He is accompanied by his wife for take care of as well.  Past Medical History:  Diagnosis Date   Hyperlipidemia    Past Surgical History:  Procedure Laterality Date   INGUINAL HERNIA REPAIR Bilateral 2005   Family History  Problem Relation Age of Onset   Cancer Mother    Cancer Father    Cancer Brother     Social History   Tobacco Use   Smoking status: Former    Packs/day: 0.25    Years: 7.00    Total pack years: 1.75    Types: Cigarettes, Cigars    Quit date: 1984    Years since quitting: 40.1   Smokeless tobacco: Not on file   Tobacco comments:    Patient mention he smokes a cigars once in awhile  Substance Use Topics   Alcohol use: Yes    Comment: daily   Marital Status: Married  ROS  Review of Systems  Cardiovascular:  Negative for chest pain, dyspnea on exertion and leg swelling.   Objective      10/22/2022    9:56 AM 10/22/2022    9:21 AM 11/14/2014    9:03 AM  Vitals with BMI  Height  '6\' 0"'$    Weight  195 lbs   BMI  79.48   Systolic [phone]  Diastolic 84 86 81  Pulse 62 60 60   SpO2: 99 %  Physical Exam Neck:     Vascular: No carotid bruit or JVD.  Cardiovascular:     Rate and Rhythm: Normal rate and regular rhythm.     Pulses: Intact distal pulses.     Heart sounds: Normal heart sounds. No murmur heard.    No gallop.  Pulmonary:     Effort: Pulmonary effort is  normal.     Breath sounds: Normal breath sounds.  Abdominal:     General: Bowel sounds are normal.     Palpations: Abdomen is soft.     Hernia: A hernia is present. Hernia is present in the ventral area.  Musculoskeletal:     Right lower leg: No edema.     Left lower leg: No edema.     Medications and allergies  No Known Allergies   Medication list   Current Outpatient Medications:    alfuzosin (UROXATRAL) 10 MG 24 hr tablet, Take 10 mg by mouth daily with breakfast., Disp: , Rfl:    atorvastatin (LIPITOR) 20 MG tablet, Take 20 mg by mouth daily., Disp: , Rfl:    Brimonidine Tartrate-Timolol (COMBIGAN OP), Apply to eye., Disp: , Rfl:    DORZOLAMIDE HCL OP, Apply to eye., Disp: , Rfl:    mirtazapine (REMERON) 30 MG tablet, Take 30 mg by mouth at bedtime., Disp: , Rfl:    prednisoLONE acetate (PRED FORTE) 1 % ophthalmic suspension, Place 1 drop into the right eye 2 (two) times daily., Disp: , Rfl:    PROLENSA  0.07 % SOLN, Apply to eye., Disp: , Rfl:  Laboratory examination:   External labs:   Labs 09/27/2022:  Total cholesterol 167, triglycerides 52, HDL 68, LDL 89.  Non-HDL cholesterol 99.  Sodium 142, potassium 4.5, serum glucose 111 mg, BUN 12, creatinine 1.14, EGFR 60/70 mL.  LFTs normal.  A1c 6.2%.  On 05/14/2022.  Radiology:    Cardiac Studies:   Coronary calcium score 09/20/2022: LM 0 LAD 298 LCx 0 RCA 0 There is mild global cardiomegaly. Total Agatston score 290, MESA database percentile: 63rd. Fusiform ascending aorta dilatation at 41 mm, descending aorta is normal.  EKG:   EKG 10/22/2022: Sinus rhythm with first-degree AV block at rate of 62 bpm, otherwise normal EKG.    Assessment     ICD-10-CM   1. Aneurysm of ascending aorta without rupture ([place]) 09/20/2022  I71.21 EKG 12-Lead    PCV ECHOCARDIOGRAM COMPLETE    2. Elevated coronary artery calcium score 09/20/2022: Total Agatston score 290, MESA database percentile: 63rd.  R93.1 PCV CARDIAC STRESS  TEST    3. Pre-hypertension  R03.0 PCV CARDIAC STRESS TEST    4. Hyperglycemia  R73.9        Orders Placed This Encounter  Procedures   PCV CARDIAC STRESS TEST    Standing Status:   Future    Standing Expiration Date:   12/21/2022   EKG 12-Lead   PCV ECHOCARDIOGRAM COMPLETE    Standing Status:   Future    Standing Expiration Date:   10/23/2023    No orders of the defined types were placed in this encounter.   Medications Discontinued During This Encounter  Medication Reason   Nepafenac (ILEVRO OP) Patient Preference   Loteprednol Etabonate (LOTEMAX OP) Patient Preference   aspirin 81 MG tablet Patient Preference   finasteride (PROSCAR) 5 MG tablet Completed Course   zaleplon (SONATA) 10 MG capsule Patient Preference     Recommendations:   LUCCA BALLO is a 82 y.o.  Male patient with diet-controlled/pre-diabetes mellitus, hypercholesterolemia, elevated blood pressure without diagnosis of hypertension referred for evaluation of abnormal coronary calcium score, cardiomegaly and ascending aortic aneurysm noted on 09/11/2022.  1. Aneurysm of ascending aorta without rupture ([place]) 09/20/2022 Patient has a mild ascending aortic aneurysm measuring around 4.1 to 4.2 cm, I will obtain an echocardiogram to see whether I can correlate this by ultrasound.  2. Elevated coronary artery calcium score 09/20/2022: Total Agatston score 290, MESA database percentile: 63rd. Coronary calcium score revealed only a modest increase in coronary calcium score for his age, he is presently on a statin and remains asymptomatic.  Continue the same for now.  I will schedule him for a routine treadmill exercise stress test in the absence of symptoms.  His Lipitor was also recently increased to 20 mg, which is appropriate, goal LDL would be </= 70.  3. Pre-hypertension Patient's blood pressure was elevated, and various occasions I noticed that his blood pressure is marginally high around 140 mmHg with a  diastolic blood pressure around 80 mmHg +.  I will evaluate his blood pressure response to routine treadmill exercise stress test and have a low threshold to start him on losartan which would help both with ascending aortic aneurysm and also treatment of hypertension.  4. Hyperglycemia He does have hyperglycemia, he is being careful with his diet, will continue present management, losartan will certainly help as well.  I would like to see him back in 6 weeks to 2 months for follow-up of the above  and make final recommendation.  He does have ventral hernia and I advised him to wear an abdominal belt to prevent from expansion and also not to place too much intra-abdominal pressure.   Adrian Prows, MD, [facility] 10/22/2022, 10:20 AM Office: [phone]

## 2022-10-24 ENCOUNTER — Ambulatory Visit: Payer: Medicare HMO

## 2022-10-24 DIAGNOSIS — I7121 Aneurysm of the ascending aorta, without rupture: Secondary | ICD-10-CM

## 2022-10-28 ENCOUNTER — Ambulatory Visit: Payer: Medicare HMO

## 2022-10-28 DIAGNOSIS — R030 Elevated blood-pressure reading, without diagnosis of hypertension: Secondary | ICD-10-CM

## 2022-10-28 DIAGNOSIS — R931 Abnormal findings on diagnostic imaging of heart and coronary circulation: Secondary | ICD-10-CM

## 2022-11-01 NOTE — Progress Notes (Signed)
Discuss on OV soon. Low risk  Exercise treadmill stress test 10/28/2022: Exercise treadmill stress test performed using Bruce protocol.  Patient reached 8.2 METS, and 92*% of age predicted maximum heart rate.  Exercise capacity was good.  No chest pain reported.  Normal heart rate and hemodynamic response. The baseline blood pressure was 110/78 mmHg and increased to 140/78 mmHg at peak exercise. Peak stress EKG is uninterpretable due to motion artifact. EKG at 1 min into recovery showed sinus rhythm at 99 bpm, possible old inferior infarct, no significant ST-T abnormality.  Recommend clinical correlation.

## 2022-11-01 NOTE — Progress Notes (Signed)
Echocardiogram 10/24/2022: Normal LV systolic function with visual EF 60-65%. Left ventricle cavity is normal in size. Normal left ventricular wall thickness. Normal global wall motion. Normal diastolic filling pattern, normal LAP. No significant valvular heart disease.  The aortic root (35 mm)and proximal ascending aorta (32 mm) measure within normal limits.  No prior study for comparison. Discuss on OV soon. NO correlation with CT regarding aortic aneurysm

## 2022-12-17 ENCOUNTER — Ambulatory Visit: Payer: Medicare HMO | Admitting: Cardiology

## 2022-12-17 ENCOUNTER — Encounter: Payer: Self-pay | Admitting: Cardiology

## 2022-12-17 VITALS — BP 137/80 | HR 59 | Resp 16 | Ht 72.0 in | Wt 198.0 lb

## 2022-12-17 DIAGNOSIS — R931 Abnormal findings on diagnostic imaging of heart and coronary circulation: Secondary | ICD-10-CM

## 2022-12-17 DIAGNOSIS — I7121 Aneurysm of the ascending aorta, without rupture: Secondary | ICD-10-CM

## 2022-12-17 DIAGNOSIS — R030 Elevated blood-pressure reading, without diagnosis of hypertension: Secondary | ICD-10-CM

## 2022-12-17 NOTE — Progress Notes (Signed)
Primary Physician/Referring:  Deland Pretty, MD  Patient ID: Joseph [name], male    DOB: 07-28-1941, 82 y.o.   MRN: [id number]  Chief Complaint  Patient presents with   Aneurysm of ascending aorta without rupture ([place]) 09/20/2022   Follow-up   HPI:    Joseph [name]  is a 82 y.o. Male patient with diet-controlled/pre-diabetes mellitus, hypercholesterolemia, elevated blood pressure without diagnosis of hypertension referred for evaluation of abnormal coronary calcium score, cardiomegaly and ascending aortic aneurysm noted on 09/11/2022.  He is asymptomatic.  He is accompanied by his wife for take care of as well.  Past Medical History:  Diagnosis Date   Hyperlipidemia    Past Surgical History:  Procedure Laterality Date   INGUINAL HERNIA REPAIR Bilateral 2005   Family History  Problem Relation Age of Onset   Cancer Mother    Cancer Father    Cancer Brother     Social History   Tobacco Use   Smoking status: Former    Packs/day: 0.25    Years: 7.00    Additional pack years: 0.00    Total pack years: 1.75    Types: Cigarettes, Cigars    Quit date: 1984    Years since quitting: 40.2   Smokeless tobacco: Not on file   Tobacco comments:    Patient mention he smokes a cigars once in awhile  Substance Use Topics   Alcohol use: Yes    Comment: daily   Marital Status: Married  ROS  Review of Systems  Cardiovascular:  Negative for chest pain, dyspnea on exertion and leg swelling.   Objective      12/17/2022    8:50 AM 10/22/2022    9:56 AM 10/22/2022    9:21 AM  Vitals with BMI  Height 6\' 0"   6\' 0"   Weight 198 lbs  195 lbs  BMI 0000000  [id number]  Systolic 0000000 [id number]  Diastolic 80 84 86  Pulse 59 62 60   SpO2: 93 %  Physical Exam Neck:     Vascular: No carotid bruit or JVD.  Cardiovascular:     Rate and Rhythm: Normal rate and regular rhythm.     Pulses: Intact distal pulses.     Heart sounds: Normal heart sounds. No murmur heard.    No gallop.   Pulmonary:     Effort: Pulmonary effort is normal.     Breath sounds: Normal breath sounds.  Abdominal:     General: Bowel sounds are normal.     Palpations: Abdomen is soft.     Hernia: A hernia is present. Hernia is present in the ventral area.  Musculoskeletal:     Right lower leg: No edema.     Left lower leg: No edema.     Medications and allergies  No Known Allergies   Medication list   Current Outpatient Medications:    alfuzosin (UROXATRAL) 10 MG 24 hr tablet, Take 10 mg by mouth daily with breakfast., Disp: , Rfl:    atorvastatin (LIPITOR) 20 MG tablet, Take 20 mg by mouth daily., Disp: , Rfl:    Brimonidine Tartrate-Timolol (COMBIGAN OP), Apply to eye., Disp: , Rfl:    DORZOLAMIDE HCL OP, Apply to eye., Disp: , Rfl:    mirtazapine (REMERON) 30 MG tablet, Take 30 mg by mouth at bedtime., Disp: , Rfl:    prednisoLONE acetate (PRED FORTE) 1 % ophthalmic suspension, Place 1 drop into the right eye 2 (two) times daily., Disp: , Rfl:  PROLENSA 0.07 % SOLN, Apply to eye., Disp: , Rfl:  Laboratory examination:   External labs:   Labs 09/27/2022:  Total cholesterol 167, triglycerides 52, HDL 68, LDL 89.  Non-HDL cholesterol 99.  Sodium 142, potassium 4.5, serum glucose 111 mg, BUN 12, creatinine 1.14, EGFR 60/70 mL.  LFTs normal.  A1c 6.2%.  On 05/14/2022.  Radiology:    Cardiac Studies:   Coronary calcium score 09/20/2022: LM 0 LAD 298 LCx 0 RCA 0 There is mild global cardiomegaly. Total Agatston score 290, MESA database percentile: 63rd. Fusiform ascending aorta dilatation at 41 mm, descending aorta is normal.  PCV ECHOCARDIOGRAM COMPLETE 10/24/2022  Narrative Echocardiogram 10/24/2022: Normal LV systolic function with visual EF 60-65%. Left ventricle cavity is normal in size. Normal left ventricular wall thickness. Normal global wall motion. Normal diastolic filling pattern, normal LAP. No significant valvular heart disease. The aortic root (35 mm)and  proximal ascending aorta (32 mm) measure within normal limits. No prior study for comparison.    PCV CARDIAC STRESS TEST 10/28/2022 Exercise treadmill stress test 10/28/2022: Exercise treadmill stress test performed using Bruce protocol.  Patient reached 8.2 METS, and 92*% of age predicted maximum heart rate.  Exercise capacity was good.  No chest pain reported.  Normal heart rate and hemodynamic response. The baseline blood pressure was 110/78 mmHg and increased to 140/78 mmHg at peak exercise. Peak stress EKG is uninterpretable due to motion artifact. EKG at 1 min into recovery showed sinus rhythm at 99 bpm, possible old inferior infarct, no significant ST-T abnormality.  Recommend clinical correlation.    EKG:   EKG 10/22/2022: Sinus rhythm with first-degree AV block at rate of 62 bpm, otherwise normal EKG.    Assessment     ICD-10-CM   1. Aneurysm of ascending aorta without rupture ([place]) 09/20/2022  I71.21     2. Elevated coronary artery calcium score 09/20/2022: Total Agatston score 290, MESA database percentile: 63rd.  R93.1     3. Pre-hypertension  R03.0        No orders of the defined types were placed in this encounter.   No orders of the defined types were placed in this encounter.   There are no discontinued medications.    Recommendations:   Joseph [name] is a 82 y.o.  Male patient with diet-controlled/pre-diabetes mellitus, hypercholesterolemia, elevated blood pressure without diagnosis of hypertension referred for evaluation of abnormal coronary calcium score, cardiomegaly and ascending aortic aneurysm noted on 09/11/2022.  1. Aneurysm of ascending aorta without rupture ([place]) 09/20/2022 Patient has very mild ascending aortic root dilatation, not seen by echocardiogram.  I will probably consider repeating coronary calcium score in 2 years to follow-up on this.  2. Elevated coronary artery calcium score 09/20/2022: Total Agatston score 290, MESA database  percentile: 63rd. His coronary calcium score is only modestly elevated for his age.  He is presently 82 y.o. and looks remarkably well, he is presently on low-dose of a high intensity statin, continue the same.  3. Pre-hypertension Blood pressure is normal, minimal elevation at 137 mmHg, with 5 pound weight loss, watching his salt intake, I certainly think that his blood sugar will improve and his blood pressure will be normalized.  During the treadmill exercise stress test, he had normal blood pressure response.  Hence I do not think that he has hypertension.  I will see him back in 2 years unless there is a new problem, it is a pleasure to see him today.    Adrian Prows, MD,  [facility] 12/17/2022, 9:20 AM Office: [phone]

## 2023-05-05 ENCOUNTER — Encounter (INDEPENDENT_AMBULATORY_CARE_PROVIDER_SITE_OTHER): Payer: Medicare HMO | Admitting: Ophthalmology

## 2023-05-05 DIAGNOSIS — H353112 Nonexudative age-related macular degeneration, right eye, intermediate dry stage: Secondary | ICD-10-CM

## 2023-05-05 DIAGNOSIS — H35371 Puckering of macula, right eye: Secondary | ICD-10-CM

## 2023-05-05 DIAGNOSIS — H59031 Cystoid macular edema following cataract surgery, right eye: Secondary | ICD-10-CM

## 2023-05-05 DIAGNOSIS — H338 Other retinal detachments: Secondary | ICD-10-CM | POA: Diagnosis not present

## 2023-05-05 DIAGNOSIS — H43813 Vitreous degeneration, bilateral: Secondary | ICD-10-CM | POA: Diagnosis not present

## 2023-05-05 DIAGNOSIS — H33301 Unspecified retinal break, right eye: Secondary | ICD-10-CM

## 2023-10-24 ENCOUNTER — Encounter (INDEPENDENT_AMBULATORY_CARE_PROVIDER_SITE_OTHER): Payer: Medicare HMO | Admitting: Ophthalmology

## 2023-10-24 DIAGNOSIS — H35371 Puckering of macula, right eye: Secondary | ICD-10-CM | POA: Diagnosis not present

## 2023-10-24 DIAGNOSIS — H33303 Unspecified retinal break, bilateral: Secondary | ICD-10-CM | POA: Diagnosis not present

## 2023-10-24 DIAGNOSIS — H43813 Vitreous degeneration, bilateral: Secondary | ICD-10-CM

## 2023-10-24 DIAGNOSIS — H59031 Cystoid macular edema following cataract surgery, right eye: Secondary | ICD-10-CM

## 2023-10-24 DIAGNOSIS — H338 Other retinal detachments: Secondary | ICD-10-CM

## 2023-10-24 DIAGNOSIS — H2702 Aphakia, left eye: Secondary | ICD-10-CM

## 2023-11-03 ENCOUNTER — Encounter (INDEPENDENT_AMBULATORY_CARE_PROVIDER_SITE_OTHER): Payer: Medicare HMO | Admitting: Ophthalmology

## 2024-04-16 ENCOUNTER — Encounter (INDEPENDENT_AMBULATORY_CARE_PROVIDER_SITE_OTHER): Payer: Medicare HMO | Admitting: Ophthalmology

## 2024-04-16 DIAGNOSIS — H338 Other retinal detachments: Secondary | ICD-10-CM | POA: Diagnosis not present

## 2024-04-16 DIAGNOSIS — H33301 Unspecified retinal break, right eye: Secondary | ICD-10-CM

## 2024-04-16 DIAGNOSIS — H43813 Vitreous degeneration, bilateral: Secondary | ICD-10-CM

## 2024-04-16 DIAGNOSIS — H59031 Cystoid macular edema following cataract surgery, right eye: Secondary | ICD-10-CM

## 2024-06-18 LAB — LAB REPORT - SCANNED
A1c: 6
Albumin, Urine POC: 6.2
Creatinine, POC: 150.4 mg/dL
EGFR: 60
Microalb Creat Ratio: 4

## 2024-07-01 ENCOUNTER — Ambulatory Visit: Payer: Self-pay

## 2024-10-15 ENCOUNTER — Encounter (INDEPENDENT_AMBULATORY_CARE_PROVIDER_SITE_OTHER): Admitting: Ophthalmology

## 2024-10-22 ENCOUNTER — Encounter (INDEPENDENT_AMBULATORY_CARE_PROVIDER_SITE_OTHER): Admitting: Ophthalmology

## 2024-10-29 ENCOUNTER — Encounter (INDEPENDENT_AMBULATORY_CARE_PROVIDER_SITE_OTHER): Admitting: Ophthalmology

## 2024-12-16 ENCOUNTER — Ambulatory Visit: Payer: Medicare HMO | Admitting: Cardiology

## 2024-12-16 ENCOUNTER — Ambulatory Visit: Admitting: Cardiology

## 2025-04-29 ENCOUNTER — Encounter (INDEPENDENT_AMBULATORY_CARE_PROVIDER_SITE_OTHER): Admitting: Ophthalmology
